# Patient Record
Sex: Female | Born: 1985 | Race: White | Hispanic: No | Smoking: Never smoker
Health system: Southern US, Community
[De-identification: ages and names within clinical notes are randomized; demographics above are authoritative.]

## PROBLEM LIST (undated history)

## (undated) DIAGNOSIS — Z8711 Personal history of peptic ulcer disease: Secondary | ICD-10-CM

## (undated) DIAGNOSIS — Z820 Family history of epilepsy and other diseases of the nervous system: Secondary | ICD-10-CM

## (undated) DIAGNOSIS — J36 Peritonsillar abscess: Secondary | ICD-10-CM

## (undated) DIAGNOSIS — R112 Nausea with vomiting, unspecified: Secondary | ICD-10-CM

## (undated) DIAGNOSIS — Z9889 Other specified postprocedural states: Secondary | ICD-10-CM

## (undated) DIAGNOSIS — E785 Hyperlipidemia, unspecified: Secondary | ICD-10-CM

## (undated) DIAGNOSIS — Z8719 Personal history of other diseases of the digestive system: Secondary | ICD-10-CM

## (undated) HISTORY — DX: Personal history of other diseases of the digestive system: Z87.19

## (undated) HISTORY — DX: Family history of epilepsy and other diseases of the nervous system: Z82.0

## (undated) HISTORY — DX: Hyperlipidemia, unspecified: E78.5

## (undated) HISTORY — DX: Personal history of peptic ulcer disease: Z87.11

## (undated) HISTORY — DX: Peritonsillar abscess: J36

---
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
## 2014-06-18 HISTORY — PX: SEPTOPLASTY: SUR1290

## 2015-01-02 ENCOUNTER — Encounter: Payer: Self-pay | Admitting: Physician Assistant

## 2015-01-02 ENCOUNTER — Ambulatory Visit (INDEPENDENT_AMBULATORY_CARE_PROVIDER_SITE_OTHER): Payer: Managed Care, Other (non HMO) | Admitting: Physician Assistant

## 2015-01-02 VITALS — BP 110/70 | HR 76 | Temp 98.8°F | Resp 16 | Ht 62.0 in | Wt 134.8 lb

## 2015-01-02 DIAGNOSIS — E041 Nontoxic single thyroid nodule: Secondary | ICD-10-CM

## 2015-01-02 DIAGNOSIS — R197 Diarrhea, unspecified: Secondary | ICD-10-CM

## 2015-01-02 DIAGNOSIS — E7800 Pure hypercholesterolemia, unspecified: Secondary | ICD-10-CM

## 2015-01-02 DIAGNOSIS — Z7189 Other specified counseling: Secondary | ICD-10-CM | POA: Diagnosis not present

## 2015-01-02 DIAGNOSIS — Z7689 Persons encountering health services in other specified circumstances: Secondary | ICD-10-CM

## 2015-01-02 NOTE — Progress Notes (Signed)
Patient: Kimberly Cruz, Female    DOB: 19-Dec-1985, 29 y.o.   MRN: [id number] Visit Date: 01/02/2015  Today's Provider: Mar Daring, PA-C   No chief complaint on file.  Subjective:    Annual physical exam Kimberly Cruz is a 29 y.o. female who presents today as a new patient and for health maintenance and complete physical. She feels well. She reports not exercising. She reports she is sleeping well.  Physicians took care of patient are [facility] [phone], [facility] Dr.Michaela Marvel Plan [phone] and [facility] Dr. Beryle Flock [phone].Patient moved from [place], [place] August of this year.  Last pap was 03/16. She states that she has had abnormal Pap smears in the past and did undergo a LEEP procedure many years ago. Most recent Pap that was done in March of this year was normal. She has been cleared from having Pap smears every 6 months to having Pap smears yearly at this time. Will be due for her Pap again in March 2017.  She has never had a mammogram. She does perform monthly self breast exams. There is no known family history of breast cancer.  She has never had a colonoscopy. She does state that her father and her grandmother have both been diagnosed with diverticulitis. There is no family history of colon cancer. Her father also has irritable bowel syndrome.  Patient got sick from the recall of the Hummus 3 weeks ago and per patient still going to the bathroom since she got sick. She states that initially she had upset stomach, fevers, chills, nausea, vomiting and diarrhea. All of the other symptoms have improved except the diarrhea. She states she has 3-4 loose bowels daily. She states that they were just watery stool. She has not noticed any hematochezia or melena. Does have abdominal cramping in the morning.  She also complains of increased rhinorrhea. She states that right before she moved to [place] in August she had underwent a septoplasty and rhinoplasty for deviated septum. Since her surgery she has had increased nasal discharge which also has caused her to have a cough. She states this most often affects her initially when she first awakes in the morning but as she is up throughout the day it does improve. She does not have any known allergies or seasonal allergies. She is planning to see her ENT physician that did her surgery for her over the holiday break.   Review of Systems  Constitutional: Negative.   HENT: Positive for rhinorrhea.   Eyes: Negative.   Respiratory: Negative.   Cardiovascular: Negative.   Gastrointestinal: Positive for diarrhea.  Endocrine: Negative.   Genitourinary: Negative.   Musculoskeletal: Negative.   Skin: Negative.   Allergic/Immunologic: Negative.   Neurological: Negative.   Hematological: Negative.   Psychiatric/Behavioral: Negative.     Social History      She  reports that she has never smoked. She has never used smokeless tobacco. She reports that she does not drink alcohol or use illicit drugs.       Social History   Social History  . Marital Status: Single    Spouse Name: N/A  . Number of Children: N/A  . Years of Education: N/A   Social History Main Topics  . Smoking status: Never Smoker   . Smokeless tobacco: Never Used  . Alcohol Use: No  . Drug Use: No  . Sexual Activity: Not Asked   Other Topics Concern  .  None   Social History Narrative  . None    There are no active problems to display for this patient.   Past Surgical History  Procedure Laterality Date  . Ankle surgery  2000    Family History        Family Status  Relation Status Death Age  . Mother Alive   . Father Alive     colyn polyps        Her family history is not on file.    No Known Allergies  Previous Medications   LEVONORGESTREL-ETHINYL ESTRADIOL (SEASONIQUE) 0.15-0.03 &0.01 MG TABLET    Take 1 tablet by mouth daily.    Patient  Care Team: Mar Daring, PA-C as PCP - General (Family Medicine)     Objective:   Vitals: BP 110/70 mmHg  Pulse 76  Temp(Src) 98.8 F (37.1 C) (Oral)  Resp 16  Ht 5\' 2"  (1.575 m)  Wt 134 lb 12.8 oz (61.145 kg)  BMI 24.65 kg/m2  LMP 11/02/2014   Physical Exam  Constitutional: She is oriented to person, place, and time. She appears well-developed and well-nourished. No distress.  HENT:  Head: Normocephalic and atraumatic.  Right Ear: External ear normal.  Left Ear: External ear normal.  Nose: Nose normal.  Mouth/Throat: Oropharynx is clear and moist. No oropharyngeal exudate.  Eyes: Conjunctivae and EOM are normal. Pupils are equal, round, and reactive to light. Right eye exhibits no discharge. Left eye exhibits no discharge. No scleral icterus.  Neck: Normal range of motion. Neck supple. No JVD present. No tracheal deviation present. Thyroid mass (palpable thyroid nodules bilaterally) present. No thyromegaly present.  Cardiovascular: Normal rate, regular rhythm, normal heart sounds and intact distal pulses.  Exam reveals no gallop and no friction rub.   No murmur heard. Pulmonary/Chest: Effort normal and breath sounds normal. No respiratory distress. She has no wheezes. She has no rales. She exhibits no tenderness.  Abdominal: Soft. She exhibits no shifting dullness, no distension, no fluid wave, no abdominal bruit, no ascites, no pulsatile midline mass and no mass. Bowel sounds are increased. There is no hepatosplenomegaly. There is no tenderness. There is no rebound, no guarding and no CVA tenderness. No hernia.  Musculoskeletal: Normal range of motion. She exhibits no edema or tenderness.  Lymphadenopathy:    She has no cervical adenopathy.  Neurological: She is alert and oriented to person, place, and time.  Skin: Skin is warm and dry. No rash noted. She is not diaphoretic.  Psychiatric: She has a normal mood and affect. Her behavior is normal. Judgment and thought  content normal.  Vitals reviewed.    Depression Screen No flowsheet data found.    Assessment & Plan:     Routine Health Maintenance and Physical Exam  1. Establishing care with new doctor, encounter for Recently moved from [facility] to [place] in August. Establishing with local provider.  2. 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 She has been doing symptomatic treatment and conservative treatment. She has not had any response. She continues to have 3-4 loose bowels daily. I did discuss possibly treating with an antibiotic for the listeria but she would like to undergo testing of her stool prior to treatment. We'll check stool samples as below as well as check labs. I will follow-up with her pending the results of all of these. She is to call the office if symptoms worsen in the  meantime. - Stool Culture - Stool C-Diff Toxin Assay - Stool, WBC/Lactoferrin - Giardia, EIA; Ova/Parasite - CBC with Differential - Comprehensive Metabolic Panel (CMET)  3. Thyroid nodule She does have thyroid nodules bilaterally. She states this was diagnosed 8 years ago. She has had biopsies and they were shown to be benign. She did undergo a thyroid ultrasound last year and they were stable from her previous initial diagnosis. Labs as below and follow-up pending lab results. She is to call the office if she has any questions or concerns in the meantime. - Comprehensive Metabolic Panel (CMET) - TSH  4. Hypercholesterolemia He states that the last time her cholesterol was checked a told her that her cholesterol was high but did not say which portion of the cholesterol panel had been elevated. They just told her to work on lifestyle modification which she has been doing. She has not had her cholesterol checked since then. I will recheck her cholesterol panel as below and follow-up pending lab results.  If cholesterol is stable she will not need this lab check for one year. She is to call the office if she has any questions or concerns in the meantime. - Lipid panel   Exercise Activities and Dietary recommendations Goals    None       There is no immunization history on file for this patient.  Health Maintenance  Topic Date Due  . HIV Screening  06/20/2002  . TETANUS/TDAP  06/20/2006  . PAP SMEAR  03/18/2014  . INFLUENZA VACCINE  08/18/2014      Discussed health benefits of physical activity, and encouraged her to engage in regular exercise appropriate for her age and condition.    --------------------------------------------------------------------

## 2015-01-02 NOTE — Patient Instructions (Signed)
Food Choices to Help Relieve Diarrhea, Adult When you have diarrhea, the foods you eat and your eating habits are very important. Choosing the right foods and drinks can help relieve diarrhea. Also, because diarrhea can last up to 7 days, you need to replace lost fluids and electrolytes (such as sodium, potassium, and chloride) in order to help prevent dehydration.  WHAT GENERAL GUIDELINES DO I NEED TO FOLLOW?  Slowly drink 1 cup (8 oz) of fluid for each episode of diarrhea. If you are getting enough fluid, your urine will be clear or pale yellow.  Eat starchy foods. Some good choices include white rice, white toast, pasta, low-fiber cereal, baked potatoes (without the skin), saltine crackers, and bagels.  Avoid large servings of any cooked vegetables.  Limit fruit to two servings per day. A serving is  cup or 1 small piece.  Choose foods with less than 2 g of fiber per serving.  Limit fats to less than 8 tsp (38 g) per day.  Avoid fried foods.  Eat foods that have probiotics in them. Probiotics can be found in certain dairy products.  Avoid foods and beverages that may increase the speed at which food moves through the stomach and intestines (gastrointestinal tract). Things to avoid include:  High-fiber foods, such as dried fruit, raw fruits and vegetables, nuts, seeds, and whole grain foods.  Spicy foods and high-fat foods.  Foods and beverages sweetened with high-fructose corn syrup, honey, or sugar alcohols such as xylitol, sorbitol, and mannitol. WHAT FOODS ARE RECOMMENDED? Grains White rice. White, French, or pita breads (fresh or toasted), including plain rolls, buns, or bagels. White pasta. Saltine, soda, or graham crackers. Pretzels. Low-fiber cereal. Cooked cereals made with water (such as cornmeal, farina, or cream cereals). Plain muffins. Matzo. Melba toast. Zwieback.  Vegetables Potatoes (without the skin). Strained tomato and vegetable juices. Most well-cooked and canned  vegetables without seeds. Tender lettuce. Fruits Cooked or canned applesauce, apricots, cherries, fruit cocktail, grapefruit, peaches, pears, or plums. Fresh bananas, apples without skin, cherries, grapes, cantaloupe, grapefruit, peaches, oranges, or plums.  Meat and Other Protein Products Baked or boiled chicken. Eggs. Tofu. Fish. Seafood. Smooth peanut butter. Ground or well-cooked tender beef, ham, veal, lamb, pork, or poultry.  Dairy Plain yogurt, kefir, and unsweetened liquid yogurt. Lactose-free milk, buttermilk, or soy milk. Plain hard cheese. Beverages Sport drinks. Clear broths. Diluted fruit juices (except prune). Regular, caffeine-free sodas such as ginger ale. Water. Decaffeinated teas. Oral rehydration solutions. Sugar-free beverages not sweetened with sugar alcohols. Other Bouillon, broth, or soups made from recommended foods.  The items listed above may not be a complete list of recommended foods or beverages. Contact your dietitian for more options. WHAT FOODS ARE NOT RECOMMENDED? Grains Whole grain, whole wheat, bran, or rye breads, rolls, pastas, crackers, and cereals. Wild or brown rice. Cereals that contain more than 2 g of fiber per serving. Corn tortillas or taco shells. Cooked or dry oatmeal. Granola. Popcorn. Vegetables Raw vegetables. Cabbage, broccoli, Brussels sprouts, artichokes, baked beans, beet greens, corn, kale, legumes, peas, sweet potatoes, and yams. Potato skins. Cooked spinach and cabbage. Fruits Dried fruit, including raisins and dates. Raw fruits. Stewed or dried prunes. Fresh apples with skin, apricots, mangoes, pears, raspberries, and strawberries.  Meat and Other Protein Products Chunky peanut butter. Nuts and seeds. Beans and lentils. Bacon.  Dairy High-fat cheeses. Milk, chocolate milk, and beverages made with milk, such as milk shakes. Cream. Ice cream. Sweets and Desserts Sweet rolls, doughnuts, and sweet breads.   Pancakes and waffles. Fats and  Oils Butter. Cream sauces. Margarine. Salad oils. Plain salad dressings. Olives. Avocados.  Beverages Caffeinated beverages (such as coffee, tea, soda, or energy drinks). Alcoholic beverages. Fruit juices with pulp. Prune juice. Soft drinks sweetened with high-fructose corn syrup or sugar alcohols. Other Coconut. Hot sauce. Chili powder. Mayonnaise. Gravy. Cream-based or milk-based soups.  The items listed above may not be a complete list of foods and beverages to avoid. Contact your dietitian for more information. WHAT SHOULD I DO IF I BECOME DEHYDRATED? Diarrhea can sometimes lead to dehydration. Signs of dehydration include dark urine and dry mouth and skin. If you think you are dehydrated, you should rehydrate with an oral rehydration solution. These solutions can be purchased at pharmacies, retail stores, or online.  Drink -1 cup (120-240 mL) of oral rehydration solution each time you have an episode of diarrhea. If drinking this amount makes your diarrhea worse, try drinking smaller amounts more often. For example, drink 1-3 tsp (5-15 mL) every 5-10 minutes.  A general rule for staying hydrated is to drink 1-2 L of fluid per day. Talk to your health care provider about the specific amount you should be drinking each day. Drink enough fluids to keep your urine clear or pale yellow.   This information is not intended to replace advice given to you by your health care provider. Make sure you discuss any questions you have with your health care provider.   Document Released: 03/26/2003 Document Revised: 01/24/2014 Document Reviewed: 11/26/2012 Elsevier Interactive Patient Education 2016 Elsevier Inc. Diarrhea Diarrhea is frequent loose and watery bowel movements. It can cause you to feel weak and dehydrated. Dehydration can cause you to become tired and thirsty, have a dry mouth, and have decreased urination that often is dark yellow. Diarrhea is a sign of another problem, most often an  infection that will not last long. In most cases, diarrhea typically lasts 2-3 days. However, it can last longer if it is a sign of something more serious. It is important to treat your diarrhea as directed by your caregiver to lessen or prevent future episodes of diarrhea. CAUSES  Some common causes include:  Gastrointestinal infections caused by viruses, bacteria, or parasites.  Food poisoning or food allergies.  Certain medicines, such as antibiotics, chemotherapy, and laxatives.  Artificial sweeteners and fructose.  Digestive disorders. HOME CARE INSTRUCTIONS  Ensure adequate fluid intake (hydration): Have 1 cup (8 oz) of fluid for each diarrhea episode. Avoid fluids that contain simple sugars or sports drinks, fruit juices, whole milk products, and sodas. Your urine should be clear or pale yellow if you are drinking enough fluids. Hydrate with an oral rehydration solution that you can purchase at pharmacies, retail stores, and online. You can prepare an oral rehydration solution at home by mixing the following ingredients together:   - tsp table salt.   tsp baking soda.   tsp salt substitute containing potassium chloride.  1  tablespoons sugar.  1 L (34 oz) of water.  Certain foods and beverages may increase the speed at which food moves through the gastrointestinal (GI) tract. These foods and beverages should be avoided and include:  Caffeinated and alcoholic beverages.  High-fiber foods, such as raw fruits and vegetables, nuts, seeds, and whole grain breads and cereals.  Foods and beverages sweetened with sugar alcohols, such as xylitol, sorbitol, and mannitol.  Some foods may be well tolerated and may help thicken stool including:  Starchy foods, such as rice, toast, pasta,   low-sugar cereal, oatmeal, grits, baked potatoes, crackers, and bagels.  Bananas.  Applesauce.  Add probiotic-rich foods to help increase healthy bacteria in the GI tract, such as yogurt and  fermented milk products.  Wash your hands well after each diarrhea episode.  Only take over-the-counter or prescription medicines as directed by your caregiver.  Take a warm bath to relieve any burning or pain from frequent diarrhea episodes. SEEK IMMEDIATE MEDICAL CARE IF:   You are unable to keep fluids down.  You have persistent vomiting.  You have blood in your stool, or your stools are black and tarry.  You do not urinate in 6-8 hours, or there is only a small amount of very dark urine.  You have abdominal pain that increases or localizes.  You have weakness, dizziness, confusion, or light-headedness.  You have a severe headache.  Your diarrhea gets worse or does not get better.  You have a fever or persistent symptoms for more than 2-3 days.  You have a fever and your symptoms suddenly get worse. MAKE SURE YOU:   Understand these instructions.  Will watch your condition.  Will get help right away if you are not doing well or get worse.   This information is not intended to replace advice given to you by your health care provider. Make sure you discuss any questions you have with your health care provider.   Document Released: 12/24/2001 Document Revised: 01/24/2014 Document Reviewed: 09/11/2011 Elsevier Interactive Patient Education 2016 Elsevier Inc.  

## 2015-01-03 LAB — COMPREHENSIVE METABOLIC PANEL
A/G RATIO: 1.7 (ref 1.1–2.5)
ALK PHOS: 58 IU/L (ref 39–117)
ALT: 13 IU/L (ref 0–32)
AST: 14 IU/L (ref 0–40)
Albumin: 4.5 g/dL (ref 3.5–5.5)
BILIRUBIN TOTAL: 0.5 mg/dL (ref 0.0–1.2)
BUN/Creatinine Ratio: 11 (ref 8–20)
BUN: 7 mg/dL (ref 6–20)
CHLORIDE: 99 mmol/L (ref 96–106)
CO2: 23 mmol/L (ref 18–29)
Calcium: 9.4 mg/dL (ref 8.7–10.2)
Creatinine, Ser: 0.64 mg/dL (ref 0.57–1.00)
GFR calc Af Amer: 141 mL/min/{1.73_m2} (ref >59)
GFR calc non Af Amer: 123 mL/min/{1.73_m2} (ref >59)
GLUCOSE: 77 mg/dL (ref 65–99)
Globulin, Total: 2.6 g/dL (ref 1.5–4.5)
POTASSIUM: 4.1 mmol/L (ref 3.5–5.2)
Sodium: 140 mmol/L (ref 134–144)
Total Protein: 7.1 g/dL (ref 6.0–8.5)

## 2015-01-03 LAB — LIPID PANEL
CHOL/HDL RATIO: 4.4 ratio (ref 0.0–4.4)
CHOLESTEROL TOTAL: 188 mg/dL (ref 100–199)
HDL: 43 mg/dL (ref >39)
LDL Calculated: 120 mg/dL — ABNORMAL HIGH (ref 0–99)
TRIGLYCERIDES: 123 mg/dL (ref 0–149)
VLDL Cholesterol Cal: 25 mg/dL (ref 5–40)

## 2015-01-03 LAB — CBC WITH DIFFERENTIAL/PLATELET
BASOS ABS: 0 10*3/uL (ref 0.0–0.2)
BASOS: 0 %
EOS (ABSOLUTE): 0.1 10*3/uL (ref 0.0–0.4)
Eos: 1 %
Hematocrit: 42 % (ref 34.0–46.6)
Hemoglobin: 14.4 g/dL (ref 11.1–15.9)
IMMATURE GRANS (ABS): 0 10*3/uL (ref 0.0–0.1)
Immature Granulocytes: 0 %
LYMPHS: 22 %
Lymphocytes Absolute: 1.8 10*3/uL (ref 0.7–3.1)
MCH: 31.1 pg (ref 26.6–33.0)
MCHC: 34.3 g/dL (ref 31.5–35.7)
MCV: 91 fL (ref 79–97)
MONOS ABS: 0.6 10*3/uL (ref 0.1–0.9)
Monocytes: 7 %
NEUTROS ABS: 5.9 10*3/uL (ref 1.4–7.0)
NEUTROS PCT: 70 %
PLATELETS: 316 10*3/uL (ref 150–379)
RBC: 4.63 x10E6/uL (ref 3.77–5.28)
RDW: 12.3 % (ref 12.3–15.4)
WBC: 8.4 10*3/uL (ref 3.4–10.8)

## 2015-01-03 LAB — TSH: TSH: 0.643 u[IU]/mL (ref 0.450–4.500)

## 2015-01-05 ENCOUNTER — Telehealth: Payer: Self-pay

## 2015-01-05 NOTE — Telephone Encounter (Signed)
Patient advised as directed below.  Thanks,  -Joseline 

## 2015-01-05 NOTE — Telephone Encounter (Signed)
-----   Message from Mar Daring, [place] sent at 01/05/2015  9:20 AM EST ----- All labs are within normal limits and stable with the exception of LDL (bad) cholesterol. All other elements of cholesterol panel are within normal limits including total cholesterol being 188 and triglycerides being 123. LDL was elevated at 120. I would like to try to see this number below 100. This can be done with lifestyle modification including limiting high cholesterol, high saturated fats in diet. Also this can be lowered with physical activity. Tried to exercise approximately 30-40 minutes 3-4 days a week. Also adding Fish oil 1200 mg twice daily can help to lower her bad cholesterol and increase the good cholesterol. We will recheck labs in one year.  Thanks! -JB

## 2015-02-02 LAB — CLOSTRIDIUM DIFFICILE EIA: C DIFFICILE TOXINS A+ B, EIA: NEGATIVE

## 2015-02-03 LAB — STOOL CULTURE: E coli, Shiga toxin Assay: NEGATIVE

## 2015-02-05 LAB — GIARDIA, EIA; OVA/PARASITE: GIARDIA AG STL: NEGATIVE

## 2015-02-05 LAB — FECAL LACTOFERRIN, QUANT: Lactoferrin, Fecal, Quant.: 6.85 ug/mL(g) (ref 0.00–7.24)

## 2015-02-06 ENCOUNTER — Telehealth: Payer: Self-pay

## 2015-02-06 DIAGNOSIS — R197 Diarrhea, unspecified: Secondary | ICD-10-CM

## 2015-02-06 DIAGNOSIS — Z1283 Encounter for screening for malignant neoplasm of skin: Secondary | ICD-10-CM

## 2015-02-06 NOTE — Telephone Encounter (Signed)
LMTCB  Thanks,  -Joseline 

## 2015-02-06 NOTE — Telephone Encounter (Signed)
Pt returned call and asked for return call at work. Pt will get off work today at 12 pm so if it's after 12 please call her cell#. Thanks TNP

## 2015-02-06 NOTE — Telephone Encounter (Signed)
-----   Message from Mar Daring, [place] sent at 02/06/2015  8:50 AM EST ----- All stool cultures were negative.  Would she like referral to GI for further evaluation?

## 2015-02-06 NOTE — Telephone Encounter (Signed)
Patient advised as directed below. She does want the GI referral and also asked if you can refer her to Dermatologist-she has some concerns she will like to address.  Thanks,  -Cassandra Mcmanaman

## 2015-02-06 NOTE — Telephone Encounter (Signed)
Referrals made

## 2015-03-04 ENCOUNTER — Encounter: Payer: Self-pay | Admitting: Certified Nurse Midwife

## 2015-03-04 ENCOUNTER — Ambulatory Visit (INDEPENDENT_AMBULATORY_CARE_PROVIDER_SITE_OTHER): Payer: Managed Care, Other (non HMO) | Admitting: Certified Nurse Midwife

## 2015-03-04 VITALS — BP 125/80 | HR 73 | Resp 18 | Ht 62.0 in | Wt 135.0 lb

## 2015-03-04 DIAGNOSIS — N898 Other specified noninflammatory disorders of vagina: Secondary | ICD-10-CM

## 2015-03-04 DIAGNOSIS — Z113 Encounter for screening for infections with a predominantly sexual mode of transmission: Secondary | ICD-10-CM | POA: Diagnosis not present

## 2015-03-04 NOTE — Patient Instructions (Signed)

## 2015-03-04 NOTE — Progress Notes (Signed)
Patient ID: Kimberly Cruz, female   DOB: [birth year], 30 y.o.   MRN: [id number] CC: Vaginal Discharge    None     HPI Kimberly Cruz is a 30 y.o. [id number] who presents with onset of vaginal discharge and odor x 1 week   Past Medical History  Diagnosis Date  . Hyperlipidemia   . FHx: migraine headaches   . History of stomach ulcers     OB History  Gravida Para Term Preterm AB SAB TAB Ectopic Multiple Living  0 0 0 0 0 0 0 0 0 0         Past Surgical History  Procedure Laterality Date  . Ankle surgery  2000  . Septoplasty  06/2014    Social History   Social History  . Marital Status: Single    Spouse Name: N/A  . Number of Children: N/A  . Years of Education: N/A   Occupational History  . Not on file.   Social History Main Topics  . Smoking status: Never Smoker   . Smokeless tobacco: Never Used  . Alcohol Use: No  . Drug Use: No  . Sexual Activity: Yes    Birth Control/ Protection: Pill   Other Topics Concern  . Not on file   Social History Narrative    Current Outpatient Prescriptions on File Prior to Visit  Medication Sig Dispense Refill  . Levonorgestrel-Ethinyl Estradiol (SEASONIQUE) 0.15-0.03 &0.01 MG tablet Take 1 tablet by mouth daily.     No current facility-administered medications on file prior to visit.    No Known Allergies   ROS Pertinent items in HPI   PHYSICAL EXAM Filed Vitals:   03/04/15 1353  BP: 125/80  Pulse: 73  Resp: 18   General: Well nourished, well developed female in no acute distress Cardiovascular: Normal rate Respiratory: Normal effort Abdomen: Soft, nontender Back: No CVAT Extremities: No edema Neurologic:grossly intact Speculum exam: NEFG; vagina with physiologic discharge, no blood; cervix clean Bimanual exam: cervix closed, no CMT; uterus NSSP; no adnexal tenderness or masses      ASSESSMENT  1. Vaginal discharge   Wet Prep Gc Ch cx  PLAN Return Prn and for annual exam in June 2017     Medication List       This list is accurate as of: 03/04/15  2:16 PM.  Always use your most recent med list.               SEASONIQUE 0.15-0.03 &0.01 MG tablet  Generic drug:  Levonorgestrel-Ethinyl Estradiol  Take 1 tablet by mouth daily.          Larey Days, CNM 03/04/2015 2:16 PM

## 2015-03-05 ENCOUNTER — Telehealth: Payer: Self-pay | Admitting: *Deleted

## 2015-03-05 DIAGNOSIS — N760 Acute vaginitis: Principal | ICD-10-CM

## 2015-03-05 DIAGNOSIS — B9689 Other specified bacterial agents as the cause of diseases classified elsewhere: Secondary | ICD-10-CM

## 2015-03-05 LAB — GC/CHLAMYDIA PROBE AMP (~~LOC~~) NOT AT ARMC
Chlamydia: NEGATIVE
Neisseria Gonorrhea: NEGATIVE

## 2015-03-05 LAB — WET PREP, GENITAL
Trich, Wet Prep: NONE SEEN
WBC, Wet Prep HPF POC: NONE SEEN
Yeast Wet Prep HPF POC: NONE SEEN

## 2015-03-05 MED ORDER — METRONIDAZOLE 500 MG PO TABS: 500.0000 mg | ORAL_TABLET | Freq: Two times a day (BID) | ORAL | Status: DC

## 2015-03-05 NOTE — Telephone Encounter (Signed)
Pt called requesting result of wet prep, informed pt that result was indicative of BV and that medication would be sent to pharmacy, advised on medication use.  Pt will call back with any further issues.

## 2015-03-25 ENCOUNTER — Other Ambulatory Visit: Payer: Self-pay | Admitting: *Deleted

## 2015-03-25 DIAGNOSIS — Z3041 Encounter for surveillance of contraceptive pills: Secondary | ICD-10-CM

## 2015-03-25 MED ORDER — LEVONORGEST-ETH ESTRAD 91-DAY 0.15-0.03 &0.01 MG PO TABS: 1.0000 | ORAL_TABLET | Freq: Every day | ORAL | Status: DC

## 2015-03-25 NOTE — Telephone Encounter (Signed)
RF authorization for [facility] sent to [facility].  Pt received enough until 6/17

## 2015-06-02 ENCOUNTER — Encounter: Payer: Self-pay | Admitting: Obstetrics and Gynecology

## 2015-06-02 ENCOUNTER — Ambulatory Visit (INDEPENDENT_AMBULATORY_CARE_PROVIDER_SITE_OTHER): Payer: Managed Care, Other (non HMO) | Admitting: Obstetrics and Gynecology

## 2015-06-02 VITALS — BP 117/76 | HR 71 | Ht 62.0 in | Wt 136.2 lb

## 2015-06-02 DIAGNOSIS — Z113 Encounter for screening for infections with a predominantly sexual mode of transmission: Secondary | ICD-10-CM | POA: Diagnosis not present

## 2015-06-02 DIAGNOSIS — Z124 Encounter for screening for malignant neoplasm of cervix: Secondary | ICD-10-CM

## 2015-06-02 DIAGNOSIS — Z01419 Encounter for gynecological examination (general) (routine) without abnormal findings: Secondary | ICD-10-CM

## 2015-06-02 DIAGNOSIS — Z1151 Encounter for screening for human papillomavirus (HPV): Secondary | ICD-10-CM

## 2015-06-02 NOTE — Progress Notes (Signed)
Pt here for annual with pap smear. Pt requesting full STI labs. Need refill on her BC.

## 2015-06-02 NOTE — Progress Notes (Signed)
  Subjective:     Kimberly Cruz is a 30 y.o. female G49 with BMI 25 who is here for a comprehensive physical exam. The patient reports no problems. She is sexually active using OCP for contraception. She denies abnormal vaginal bleeding, discharge or pelvic pain  Past Medical History  Diagnosis Date  . Hyperlipidemia   . FHx: migraine headaches   . History of stomach ulcers    Past Surgical History  Procedure Laterality Date  . Ankle surgery  2000  . Septoplasty  06/2014   Family History  Problem Relation Age of Onset  . Diverticulitis Father   . Diabetes Maternal Grandmother   . Heart disease Maternal Grandmother   . Diverticulitis Paternal Grandmother     Social History   Social History  . Marital Status: Single    Spouse Name: N/A  . Number of Children: N/A  . Years of Education: N/A   Occupational History  . Not on file.   Social History Main Topics  . Smoking status: Never Smoker   . Smokeless tobacco: Never Used  . Alcohol Use: No  . Drug Use: No  . Sexual Activity: Yes    Birth Control/ Protection: Pill   Other Topics Concern  . Not on file   Social History Narrative   Health Maintenance  Topic Date Due  . HIV Screening  06/20/2002  . TETANUS/TDAP  06/20/2006  . PAP SMEAR  06/19/2008  . INFLUENZA VACCINE  08/18/2015       Review of Systems Pertinent items are noted in HPI.   Objective:  Blood pressure 117/76, pulse 71, height 5\' 2"  (1.575 m), weight 136 lb 3.2 oz (61.78 kg), last menstrual period 04/19/2015.     GENERAL: Well-developed, well-nourished female in no acute distress.  HEENT: Normocephalic, atraumatic. Sclerae anicteric.  NECK: Supple. Normal thyroid.  LUNGS: Clear to auscultation bilaterally.  HEART: Regular rate and rhythm. BREASTS: Symmetric in size. No palpable masses or lymphadenopathy, skin changes, or nipple drainage. ABDOMEN: Soft, nontender, nondistended. No organomegaly. PELVIC: Normal external female genitalia. Vagina  is pink and rugated.  Normal discharge. Normal appearing cervix. Uterus is normal in size. No adnexal mass or tenderness. EXTREMITIES: No cyanosis, clubbing, or edema, 2+ distal pulses.    Assessment:    Healthy female exam.      Plan:    Pap smear collected Patient desires STI testing (HIV, Hep B, Hep C, RPR ordered) Patient will be contacted with any abnormal results Refill on Seasonique provided See After Visit Summary for Counseling Recommendations

## 2015-06-03 LAB — HEPATITIS B SURFACE ANTIGEN: HEP B S AG: NEGATIVE

## 2015-06-03 LAB — HEPATITIS C ANTIBODY: HCV AB: NEGATIVE

## 2015-06-03 LAB — HIV ANTIBODY (ROUTINE TESTING W REFLEX): HIV: NONREACTIVE

## 2015-06-03 LAB — CYTOLOGY - PAP

## 2015-06-03 LAB — RPR

## 2015-06-22 ENCOUNTER — Ambulatory Visit: Payer: Managed Care, Other (non HMO) | Admitting: Obstetrics & Gynecology

## 2015-06-26 ENCOUNTER — Telehealth: Payer: Self-pay | Admitting: Physician Assistant

## 2015-06-26 DIAGNOSIS — B379 Candidiasis, unspecified: Secondary | ICD-10-CM

## 2015-06-26 DIAGNOSIS — T3695XA Adverse effect of unspecified systemic antibiotic, initial encounter: Principal | ICD-10-CM

## 2015-06-26 MED ORDER — FLUCONAZOLE 150 MG PO TABS: 150.0000 mg | ORAL_TABLET | Freq: Once | ORAL | Status: DC

## 2015-06-26 NOTE — Telephone Encounter (Signed)
Pt went to [facility] ER a couple of weeks ago for abscess in her throat.  They gave her an antibiotic and one diflucan.   She has finished the antibiotic but still has the yeast infection.  She wants to know if you will send another dose of diflucan to the pharmacy.  She uses [facility],  Please advise Pt as to what you will do.  Her call  Back is [phone]  [facility]

## 2015-06-29 NOTE — Telephone Encounter (Signed)
I sent in Kimberly Cruz on Friday and notified patient via mychart.

## 2015-07-13 NOTE — Discharge Instructions (Signed)
T & A INSTRUCTION SHEET - [facility] °[facility] ° °CREIGHTON VAUGHT, MD °PAUL H. JUENGEL, MD  °[facility] °CHAPMAN MCQUEEN, MD ° °[address] [place], [place] [address] TEL. [phone] °[address] [place] [address] [phone] ° °INFORMATION SHEET FOR A TONSILLECTOMY AND ADENDOIDECTOMY ° °About Your Tonsils and Adenoids ° The tonsils and adenoids are normal body tissues that are part of our immune system.  They normally help to protect us against diseases that may enter our mouth and nose.  However, sometimes the tonsils and/or adenoids become too large and obstruct our breathing, especially at night. °  ° If either of these things happen it helps to remove the tonsils and adenoids in order to become healthier. The operation to remove the tonsils and adenoids is called a tonsillectomy and adenoidectomy. ° °The Location of Your Tonsils and Adenoids ° The tonsils are located in the back of the throat on both side and sit in a cradle of muscles. The adenoids are located in the roof of the mouth, behind the nose, and closely associated with the opening of the Eustachian tube to the ear. ° °Surgery on Tonsils and Adenoids ° A tonsillectomy and adenoidectomy is a short operation which takes about thirty minutes.  This includes being put to sleep and being awakened.  Tonsillectomies and adenoidectomies are performed at [facility] and may require observation period in the recovery room prior to going home. ° °Following the Operation for a Tonsillectomy ° A cautery machine is used to control bleeding.  Bleeding from a tonsillectomy and adenoidectomy is minimal and postoperatively the risk of bleeding is approximately four percent, although this rarely life threatening. ° ° ° °After your tonsillectomy and adenoidectomy post-op care at home: ° °1. Our patients are able to go home the same day.  You may be given prescriptions for pain  medications and antibiotics, if indicated. °2. It is extremely important to remember that fluid intake is of utmost importance after a tonsillectomy.  The amount that you drink must be maintained in the postoperative period.  A good indication of whether a child is getting enough fluid is whether his/her urine output is constant.  As long as children are urinating or wetting their diaper every 6 - 8 hours this is usually enough fluid intake.   °3. Although rare, this is a risk of some bleeding in the first ten days after surgery.  This is usually occurs between day five and nine postoperatively.  This risk of bleeding is approximately four percent.  If you or your child should have any bleeding you should remain calm and notify our office or go directly to the Emergency Room at [facility] where they will contact us. Our doctors are available seven days a week for notification.  We recommend sitting up quietly in a chair, place an ice pack on the front of the neck and spitting out the blood gently until we are able to contact you.  Adults should gargle gently with ice water and this may help stop the bleeding.  If the bleeding does not stop after a short time, i.e. 10 to 15 minutes, or seems to be increasing again, please contact us or go to the hospital.   °4. It is common for the pain to be worse at 5 - 7 days postoperatively.  This occurs because the “scab” is peeling off and the mucous membrane (skin of   the throat) is growing back where the tonsils were.   °5. It is common for a low-grade fever, less than 102, during the first week after a tonsillectomy and adenoidectomy.  It is usually due to not drinking enough liquids, and we suggest your use liquid Tylenol or the pain medicine with Tylenol prescribed in order to keep your temperature below 102.  Please follow the directions on the back of the bottle. °6. Do not take aspirin or any products that contain aspirin such as Bufferin, Anacin,  Ecotrin, aspirin gum, Goodies, BC headache powders, etc., after a T&A because it can promote bleeding.  Please check with our office before administering any other medication that may been prescribed by other doctors during the two week post-operative period. °7. If you happen to look in the mirror or into your child’s mouth you will see white/gray patches on the back of the throat.  This is what a scab looks like in the mouth and is normal after having a T&A.  It will disappear once the tonsil area heals completely. However, it may cause a noticeable odor, and this too will disappear with time.     °8. You or your child may experience ear pain after having a T&A.  This is called referred pain and comes from the throat, but it is felt in the ears.  Ear pain is quite common and expected.  It will usually go away after ten days.  There is usually nothing wrong with the ears, and it is primarily due to the healing area stimulating the nerve to the ear that runs along the side of the throat.  Use either the prescribed pain medicine or Tylenol as needed.  °9. The throat tissues after a tonsillectomy are obviously sensitive.  Smoking around children who have had a tonsillectomy significantly increases the risk of bleeding.  DO NOT SMOKE!  ° °General Anesthesia, Adult, Care After °Refer to this sheet in the next few weeks. These instructions provide you with information on caring for yourself after your procedure. Your health care provider may also give you more specific instructions. Your treatment has been planned according to current medical practices, but problems sometimes occur. Call your health care provider if you have any problems or questions after your procedure. °WHAT TO EXPECT AFTER THE PROCEDURE °After the procedure, it is typical to experience: °· Sleepiness. °· Nausea and vomiting. °HOME CARE INSTRUCTIONS °· For the first 24 hours after general anesthesia: °¨ Have a responsible person with you. °¨ Do not  drive a car. If you are alone, do not take public transportation. °¨ Do not drink alcohol. °¨ Do not take medicine that has not been prescribed by your health care provider. °¨ Do not sign important papers or make important decisions. °¨ You may resume a normal diet and activities as directed by your health care provider. °· Change bandages (dressings) as directed. °· If you have questions or problems that seem related to general anesthesia, call the hospital and ask for the anesthetist or anesthesiologist on call. °SEEK MEDICAL CARE IF: °· You have nausea and vomiting that continue the day after anesthesia. °· You develop a rash. °SEEK IMMEDIATE MEDICAL CARE IF:  °· You have difficulty breathing. °· You have chest pain. °· You have any allergic problems. °  °This information is not intended to replace advice given to you by your health care provider. Make sure you discuss any questions you have with your health care provider. °  °Document   Released: 04/11/2000 Document Revised: 01/24/2014 Document Reviewed: 05/04/2011 °Elsevier Interactive Patient Education ©2016 [employer] ° °

## 2015-07-17 ENCOUNTER — Ambulatory Visit: Payer: Managed Care, Other (non HMO) | Admitting: Anesthesiology

## 2015-07-17 ENCOUNTER — Ambulatory Visit
Admission: RE | Admit: 2015-07-17 | Discharge: 2015-07-17 | Disposition: A | Discharge status: Home / Self Care | Payer: Managed Care, Other (non HMO) | Source: Ambulatory Visit | Attending: Unknown Physician Specialty | Admitting: Unknown Physician Specialty

## 2015-07-17 ENCOUNTER — Encounter
Admission: RE | Disposition: A | Discharge status: Home / Self Care | Payer: Self-pay | Source: Ambulatory Visit | Attending: Unknown Physician Specialty

## 2015-07-17 DIAGNOSIS — Z9889 Other specified postprocedural states: Secondary | ICD-10-CM | POA: Diagnosis not present

## 2015-07-17 DIAGNOSIS — Z87892 Personal history of anaphylaxis: Secondary | ICD-10-CM | POA: Insufficient documentation

## 2015-07-17 DIAGNOSIS — J3501 Chronic tonsillitis: Secondary | ICD-10-CM | POA: Insufficient documentation

## 2015-07-17 DIAGNOSIS — Z888 Allergy status to other drugs, medicaments and biological substances status: Secondary | ICD-10-CM | POA: Insufficient documentation

## 2015-07-17 DIAGNOSIS — Z79899 Other long term (current) drug therapy: Secondary | ICD-10-CM | POA: Insufficient documentation

## 2015-07-17 DIAGNOSIS — J039 Acute tonsillitis, unspecified: Secondary | ICD-10-CM | POA: Diagnosis present

## 2015-07-17 DIAGNOSIS — Z833 Family history of diabetes mellitus: Secondary | ICD-10-CM | POA: Insufficient documentation

## 2015-07-17 DIAGNOSIS — J351 Hypertrophy of tonsils: Secondary | ICD-10-CM | POA: Diagnosis present

## 2015-07-17 DIAGNOSIS — Z793 Long term (current) use of hormonal contraceptives: Secondary | ICD-10-CM | POA: Insufficient documentation

## 2015-07-17 HISTORY — DX: Other specified postprocedural states: Z98.890

## 2015-07-17 HISTORY — DX: Nausea with vomiting, unspecified: R11.2

## 2015-07-17 HISTORY — PX: TONSILLECTOMY: SHX5217

## 2015-07-17 SURGERY — TONSILLECTOMY
Anesthesia: General | Site: Throat | Wound class: Clean Contaminated

## 2015-07-17 MED ORDER — ONDANSETRON HCL 4 MG/2ML IJ SOLN: 4.0000 mg | Freq: Once | INTRAMUSCULAR | Status: DC | PRN

## 2015-07-17 MED ORDER — HYDROCODONE-ACETAMINOPHEN 7.5-325 MG/15ML PO SOLN: 15.0000 mL | ORAL | Status: DC | PRN

## 2015-07-17 MED ORDER — GLYCOPYRROLATE 0.2 MG/ML IJ SOLN
INTRAMUSCULAR | Status: DC | PRN
  Administered 2015-07-17: 0.1 mg via INTRAVENOUS

## 2015-07-17 MED ORDER — SCOPOLAMINE 1 MG/3DAYS TD PT72
1.0000 | MEDICATED_PATCH | Freq: Once | TRANSDERMAL | Status: DC
  Administered 2015-07-17: 1.5 mg via TRANSDERMAL

## 2015-07-17 MED ORDER — PROPOFOL 10 MG/ML IV BOLUS
INTRAVENOUS | Status: DC | PRN
  Administered 2015-07-17: 50 mg via INTRAVENOUS
  Administered 2015-07-17: 150 mg via INTRAVENOUS

## 2015-07-17 MED ORDER — DEXAMETHASONE SODIUM PHOSPHATE 4 MG/ML IJ SOLN
INTRAMUSCULAR | Status: DC | PRN
Start: 2015-07-17 — End: 2015-07-17
  Administered 2015-07-17: 8 mg via INTRAVENOUS

## 2015-07-17 MED ORDER — SUCCINYLCHOLINE CHLORIDE 20 MG/ML IJ SOLN
INTRAMUSCULAR | Status: DC | PRN
  Administered 2015-07-17: 80 mg via INTRAVENOUS

## 2015-07-17 MED ORDER — FENTANYL CITRATE (PF) 100 MCG/2ML IJ SOLN
INTRAMUSCULAR | Status: DC | PRN
  Administered 2015-07-17: 50 ug via INTRAVENOUS
  Administered 2015-07-17: 100 ug via INTRAVENOUS

## 2015-07-17 MED ORDER — BUPIVACAINE HCL (PF) 0.5 % IJ SOLN
INTRAMUSCULAR | Status: DC | PRN
Start: 2015-07-17 — End: 2015-07-17
  Administered 2015-07-17: 7 mL

## 2015-07-17 MED ORDER — OXYCODONE HCL 5 MG PO TABS: 5.0000 mg | ORAL_TABLET | Freq: Once | ORAL | Status: DC

## 2015-07-17 MED ORDER — ACETAMINOPHEN 10 MG/ML IV SOLN
1000.0000 mg | Freq: Once | INTRAVENOUS | Status: AC
  Administered 2015-07-17: 1000 mg via INTRAVENOUS

## 2015-07-17 MED ORDER — MIDAZOLAM HCL 5 MG/5ML IJ SOLN
INTRAMUSCULAR | Status: DC | PRN
  Administered 2015-07-17: 2 mg via INTRAVENOUS

## 2015-07-17 MED ORDER — LACTATED RINGERS IV SOLN
INTRAVENOUS | Status: DC
  Administered 2015-07-17: 09:00:00 via INTRAVENOUS

## 2015-07-17 MED ORDER — LIDOCAINE HCL (CARDIAC) 20 MG/ML IV SOLN
INTRAVENOUS | Status: DC | PRN
  Administered 2015-07-17: 30 mg via INTRAVENOUS

## 2015-07-17 MED ORDER — FENTANYL CITRATE (PF) 100 MCG/2ML IJ SOLN: 25.0000 ug | INTRAMUSCULAR | Status: DC | PRN

## 2015-07-17 MED ORDER — ONDANSETRON HCL 4 MG/2ML IJ SOLN
INTRAMUSCULAR | Status: DC | PRN
  Administered 2015-07-17: 4 mg via INTRAVENOUS

## 2015-07-17 MED ORDER — OXYCODONE HCL 5 MG/5ML PO SOLN
5.0000 mg | Freq: Once | ORAL | Status: AC
  Administered 2015-07-17: 5 mg via ORAL

## 2015-07-17 MED ORDER — ACETAMINOPHEN 10 MG/ML IV SOLN: 1000.0000 mg | Freq: Once | INTRAVENOUS | Status: AC

## 2015-07-17 SURGICAL SUPPLY — 17 items
CANISTER SUCT 1200ML W/VALVE (MISCELLANEOUS) ×2 IMPLANT
COAG SUCT 10F 3.5MM HAND CTRL (MISCELLANEOUS) ×2 IMPLANT
DRAPE HEAD BAR (DRAPES) ×2 IMPLANT
ELECT CAUTERY BLADE TIP 2.5 (TIP) ×2
ELECTRODE CAUTERY BLDE TIP 2.5 (TIP) ×1 IMPLANT
GLOVE BIO SURGEON STRL SZ7.5 (GLOVE) ×4 IMPLANT
HANDLE SUCTION POOLE (INSTRUMENTS) ×1 IMPLANT
KIT ROOM TURNOVER OR (KITS) ×2 IMPLANT
NEEDLE HYPO 25GX1X1/2 BEV (NEEDLE) ×2 IMPLANT
NS IRRIG 500ML POUR BTL (IV SOLUTION) ×2 IMPLANT
PACK TONSIL/ADENOIDS (PACKS) ×2 IMPLANT
PAD GROUND ADULT SPLIT (MISCELLANEOUS) ×2 IMPLANT
PENCIL ELECTRO HAND CTR (MISCELLANEOUS) ×2 IMPLANT
STRAP BODY AND KNEE 60X3 (MISCELLANEOUS) ×2 IMPLANT
SUCTION POOLE HANDLE (INSTRUMENTS) ×2
SYR 5ML LL (SYRINGE) ×2 IMPLANT
SYRINGE 10CC LL (SYRINGE) IMPLANT

## 2015-07-17 NOTE — Anesthesia Procedure Notes (Signed)
Procedure Name: Intubation Date/Time: 07/17/2015 9:47 AM Performed by: Londell Moh Pre-anesthesia Checklist: Patient identified, Emergency Drugs available, Suction available, Patient being monitored and Timeout performed Patient Re-evaluated:Patient Re-evaluated prior to inductionOxygen Delivery Method: Circle system utilized Preoxygenation: Pre-oxygenation with 100% oxygen Intubation Type: IV induction Ventilation: Mask ventilation without difficulty Laryngoscope Size: Mac and 3 Grade View: Grade I Tube type: Oral Rae Tube size: 7.0 mm Number of attempts: 2 Airway Equipment and Method: Bougie stylet Placement Confirmation: ETT inserted through vocal cords under direct vision,  positive ETCO2 and breath sounds checked- equal and bilateral Tube secured with: Tape Dental Injury: Teeth and Oropharynx as per pre-operative assessment  Difficulty Due To: Difficulty was unanticipated and Difficult Airway- due to anterior larynx

## 2015-07-17 NOTE — Op Note (Signed)
PREOPERATIVE DIAGNOSIS:  TONSIL HYPERTROPHY ACUTE TONSILLITIS  POSTOPERATIVE DIAGNOSIS:  Chronic Tonsillitis  OPERATION:  Tonsillectomy.  SURGEON:  Roena Malady, MD  ANESTHESIA:  General endotracheal.  OPERATIVE FINDINGS:  Large tonsils.  DESCRIPTION OF THE PROCEDURE: Kimberly Cruz was identified in the holding area and taken to the operating room and placed in the supine position.  After general endotracheal anesthesia, the table was turned 45 degrees and the patient was draped in the usual fashion for a tonsillectomy.  A mouth gag was inserted into the oral cavity.  There were large tonsils.  Beginning on the left-hand side a tenaculum was used to grasp the tonsil and the Bovie cautery was used to dissect it free from the fossa.  In a similar fashion, the right tonsil was removed.  Meticulous hemostasis was achieved using the Bovie cautery.  With both tonsils removed and no active bleeding, 0.5% plain Marcaine was used to inject the anterior and posterior tonsillar pillars bilaterally.  A total of 81ml was used.  The patient tolerated the procedure well and was awakened in the operating room and taken to the recovery room in stable condition.   CULTURES:  None.  SPECIMENS:  Tonsils.  ESTIMATED BLOOD LOSS:  Less than 10 ml.  Kimberly Cruz T  07/17/2015  10:05 AM

## 2015-07-17 NOTE — Anesthesia Postprocedure Evaluation (Signed)
Anesthesia Post Note  Patient: Kimberly Cruz  Procedure(s) Performed: Procedure(s) (LRB): TONSILLECTOMY (N/A)  Patient location during evaluation: PACU Anesthesia Type: General Level of consciousness: awake and alert and oriented Pain management: satisfactory to patient Vital Signs Assessment: post-procedure vital signs reviewed and stable Respiratory status: spontaneous breathing, nonlabored ventilation and respiratory function stable Cardiovascular status: blood pressure returned to baseline and stable Postop Assessment: Adequate PO intake and No signs of nausea or vomiting Anesthetic complications: no    Raliegh Ip

## 2015-07-17 NOTE — Transfer of Care (Signed)
Immediate Anesthesia Transfer of Care Note  Patient: Kimberly Cruz  Procedure(s) Performed: Procedure(s): TONSILLECTOMY (N/A)  Patient Location: PACU  Anesthesia Type: General ETT  Level of Consciousness: awake, alert  and patient cooperative  Airway and Oxygen Therapy: Patient Spontanous Breathing and Patient connected to supplemental oxygen  Post-op Assessment: Post-op Vital signs reviewed, Patient's Cardiovascular Status Stable, Respiratory Function Stable, Patent Airway and No signs of Nausea or vomiting  Post-op Vital Signs: Reviewed and stable  Complications: No apparent anesthesia complications

## 2015-07-17 NOTE — H&P (Signed)
  H+P  Reviewed and will be scanned in later. No changes noted. 

## 2015-07-17 NOTE — Anesthesia Preprocedure Evaluation (Signed)
Anesthesia Evaluation  Patient identified by MRN, date of birth, ID band  Reviewed: Allergy & Precautions, H&P , NPO status , Patient's Chart, lab work & pertinent test results  Airway Mallampati: II  TM Distance: >3 FB Neck ROM: full    Dental no notable dental hx.    Pulmonary    Pulmonary exam normal        Cardiovascular  Rhythm:regular Rate:Normal     Neuro/Psych    GI/Hepatic   Endo/Other    Renal/GU      Musculoskeletal   Abdominal   Peds  Hematology   Anesthesia Other Findings   Reproductive/Obstetrics                             Anesthesia Physical Anesthesia Plan  ASA: I  Anesthesia Plan: General ETT   Post-op Pain Management:    Induction: Intravenous  Airway Management Planned: Oral ETT  Additional Equipment:   Intra-op Plan:   Post-operative Plan:   Informed Consent: I have reviewed the patients History and Physical, chart, labs and discussed the procedure including the risks, benefits and alternatives for the proposed anesthesia with the patient or authorized representative who has indicated his/her understanding and acceptance.     Plan Discussed with: CRNA  Anesthesia Plan Comments:         Anesthesia Quick Evaluation

## 2015-07-20 NOTE — Progress Notes (Signed)
Patient states that her tongue has been numb ever since she left surgery center. Instructed patient that she should call Mcqueen's office

## 2015-07-22 LAB — SURGICAL PATHOLOGY

## 2016-01-20 ENCOUNTER — Telehealth: Payer: Self-pay | Admitting: *Deleted

## 2016-01-20 DIAGNOSIS — Z3041 Encounter for surveillance of contraceptive pills: Secondary | ICD-10-CM

## 2016-01-20 MED ORDER — LEVONORGEST-ETH ESTRAD 91-DAY 0.15-0.03 &0.01 MG PO TABS: 1.0000 | ORAL_TABLET | Freq: Every day | ORAL | 2 refills | Status: DC

## 2016-01-20 NOTE — Telephone Encounter (Signed)
Refill sent to pharmacy per Dr Hulan Fray order.

## 2016-01-20 NOTE — Telephone Encounter (Signed)
-----   Message from Francia Greaves sent at 01/20/2016  2:06 PM EST ----- Regarding: Refill  Contact: [phone] Left message w/ after hours answering service Needs a refill on OCP

## 2016-01-26 ENCOUNTER — Telehealth: Payer: Self-pay

## 2016-01-26 MED ORDER — LEVONORGEST-ETH ESTRAD 91-DAY 0.15-0.03 &0.01 MG PO TABS: 1.0000 | ORAL_TABLET | Freq: Every day | ORAL | 3 refills | Status: DC

## 2016-01-26 NOTE — Telephone Encounter (Signed)
Pt called and stated that due to her insurance can we change her seasonique to camrese and send to a new phamacy [place] [phone].

## 2016-01-26 NOTE — Telephone Encounter (Signed)
Generic [name] sent to pharmacy per pt request.

## 2016-06-21 ENCOUNTER — Ambulatory Visit: Payer: Managed Care, Other (non HMO) | Admitting: Primary Care

## 2016-06-28 ENCOUNTER — Ambulatory Visit: Payer: Managed Care, Other (non HMO) | Admitting: Primary Care

## 2016-06-28 ENCOUNTER — Ambulatory Visit (INDEPENDENT_AMBULATORY_CARE_PROVIDER_SITE_OTHER): Payer: 59 | Admitting: Primary Care

## 2016-06-28 ENCOUNTER — Encounter: Payer: Self-pay | Admitting: Primary Care

## 2016-06-28 VITALS — BP 126/78 | HR 73 | Temp 98.3°F | Ht 62.75 in | Wt 151.8 lb

## 2016-06-28 DIAGNOSIS — Z0000 Encounter for general adult medical examination without abnormal findings: Secondary | ICD-10-CM | POA: Insufficient documentation

## 2016-06-28 LAB — COMPREHENSIVE METABOLIC PANEL
ALK PHOS: 48 U/L (ref 39–117)
ALT: 23 U/L (ref 0–35)
AST: 14 U/L (ref 0–37)
Albumin: 4.3 g/dL (ref 3.5–5.2)
BUN: 12 mg/dL (ref 6–23)
CHLORIDE: 103 meq/L (ref 96–112)
CO2: 27 mEq/L (ref 19–32)
Calcium: 9.4 mg/dL (ref 8.4–10.5)
Creatinine, Ser: 0.58 mg/dL (ref 0.40–1.20)
GFR: 130.61 mL/min (ref >60.00)
GLUCOSE: 84 mg/dL (ref 70–99)
POTASSIUM: 4 meq/L (ref 3.5–5.1)
SODIUM: 136 meq/L (ref 135–145)
TOTAL PROTEIN: 7.4 g/dL (ref 6.0–8.3)
Total Bilirubin: 0.6 mg/dL (ref 0.2–1.2)

## 2016-06-28 LAB — LIPID PANEL
Cholesterol: 227 mg/dL — ABNORMAL HIGH (ref 0–200)
HDL: 42.4 mg/dL (ref >39.00)
LDL CALC: 161 mg/dL — AB (ref 0–99)
NONHDL: 184.94
Total CHOL/HDL Ratio: 5
Triglycerides: 121 mg/dL (ref 0.0–149.0)
VLDL: 24.2 mg/dL (ref 0.0–40.0)

## 2016-06-28 NOTE — Assessment & Plan Note (Signed)
Td UTD per patient, no records in [place] as she is from [place]. PAP UTD, following with GYN. Discussed to improve diet, increase veggies/fruit/whole grains. Continue exercise. Exam unremarkable. Labs pending. Follow up in 1 year.

## 2016-06-28 NOTE — Patient Instructions (Addendum)
Complete lab work prior to leaving today. I will notify you of your results once received.   It's important to improve your diet by reducing consumption of fast food, fried food, processed snack foods, sugary drinks. Increase consumption of fresh vegetables and fruits, whole grains, water.  Ensure you are drinking 64 ounces of water daily.  Continue exercising. You should be getting 150 minutes of moderate intensity exercise weekly.  Follow up in 1 year for your annual physical or sooner if needed.  It was a pleasure to meet you today! Please don't hesitate to call me with any questions. Welcome to [facility]!

## 2016-06-28 NOTE — Progress Notes (Signed)
Subjective:    Patient ID: Kimberly Cruz, female    DOB: 09/14/1985, 31 y.o.   MRN: [id number]  HPI  Kimberly Cruz is a 31 year old female who presents today to establish care and for complete physical.   Immunizations: -Tetanus: Unsure. Thinks she had one within 10 years. -Influenza: Did not complete last season  Diet: She endorses a fair diet Breakfast: Skips Lunch: Sometimes skips, sandwich (fast food) Dinner: Meat, vegetable, starch Snacks: None Desserts: 1-2 times weekly Beverages: Water, lemonade   Exercise: She exercises 3-4 times weekly for 1 hour Eye exam: Completed 1 year ago Dental exam: Completes semi-annually Pap Smear: Completed in 2017, normal   Review of Systems  Constitutional: Negative for unexpected weight change.  HENT: Negative for rhinorrhea.   Respiratory: Negative for cough and shortness of breath.   Cardiovascular: Negative for chest pain.  Gastrointestinal: Negative for constipation and diarrhea.  Genitourinary: Negative for difficulty urinating and menstrual problem.  Musculoskeletal: Negative for arthralgias and myalgias.  Skin: Negative for rash.       She has been evaluated by dermatology in the past and has had several spots removed.  Allergic/Immunologic: Negative for environmental allergies.  Neurological: Negative for dizziness, numbness and headaches.  Psychiatric/Behavioral:       Denies concerns for anxiety and depression       Past Medical History:  Diagnosis Date  . FHx: migraine headaches   . History of stomach ulcers   . Hyperlipidemia   . PONV (postoperative nausea and vomiting)   . Tonsil, abscess      Social History   Social History  . Marital status: Single    Spouse name: N/A  . Number of children: N/A  . Years of education: N/A   Occupational History  . Not on file.   Social History Main Topics  . Smoking status: Never Smoker  . Smokeless tobacco: Never Used  . Alcohol use Yes  . Drug use: No  . Sexual  activity: Yes    Birth control/ protection: Pill   Other Topics Concern  . Not on file   Social History Narrative   Single.   No children.   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    Enjoys hiking, camping, travel.    Past Surgical History:  Procedure Laterality Date  . ANKLE SURGERY  2000  . SEPTOPLASTY  06/2014  . TONSILLECTOMY N/A 07/17/2015   Procedure: TONSILLECTOMY;  Surgeon: Beverly Gust, MD;  Location: [place];  Service: ENT;  Laterality: N/A;    Family History  Problem Relation Age of Onset  . Diverticulitis Father   . Diabetes Maternal Grandmother   . Heart disease Maternal Grandmother   . Hypertension Maternal Grandmother   . Diverticulitis Paternal Grandmother   . Prostate cancer Maternal Grandfather     Allergies  Allergen Reactions  . Zithromax [Azithromycin] Swelling and Anaphylaxis    Current Outpatient Prescriptions on File Prior to Visit  Medication Sig Dispense Refill  . Levonorgestrel-Ethinyl Estradiol (AMETHIA,[name]) 0.15-0.03 &0.01 MG tablet Take 1 tablet by mouth daily. 3 Package 3   No current facility-administered medications on file prior to visit.     BP 126/78   Pulse 73   Temp 98.3 F (36.8 C) (Oral)   Ht 5' 2.75" (1.594 m)   Wt 151 lb 12.8 oz (68.9 kg)   SpO2 99%   BMI 27.10 kg/m    Objective:   Physical Exam  Constitutional: She is  oriented to person, place, and time. She appears well-nourished.  HENT:  Right Ear: Tympanic membrane and ear canal normal.  Left Ear: Tympanic membrane and ear canal normal.  Nose: Nose normal.  Mouth/Throat: Oropharynx is clear and moist.  Eyes: Conjunctivae and EOM are normal. Pupils are equal, round, and reactive to light.  Neck: Neck supple. No thyromegaly present.  Cardiovascular: Normal rate and regular rhythm.   No murmur heard. Pulmonary/Chest: Effort normal and breath sounds normal. She has no rales.  Abdominal: Soft. Bowel sounds are normal. There  is no tenderness.  Musculoskeletal: Normal range of motion.  Lymphadenopathy:    She has no cervical adenopathy.  Neurological: She is alert and oriented to person, place, and time. She has normal reflexes. No cranial nerve deficit.  Skin: Skin is warm and dry. No rash noted.  Psychiatric: She has a normal mood and affect.          Assessment & Plan:

## 2016-06-29 ENCOUNTER — Encounter: Payer: Self-pay | Admitting: *Deleted

## 2016-07-01 ENCOUNTER — Encounter: Payer: Self-pay | Admitting: Primary Care

## 2016-07-01 DIAGNOSIS — Z1283 Encounter for screening for malignant neoplasm of skin: Secondary | ICD-10-CM

## 2017-01-19 DIAGNOSIS — J209 Acute bronchitis, unspecified: Secondary | ICD-10-CM | POA: Diagnosis not present

## 2017-01-22 ENCOUNTER — Other Ambulatory Visit (HOSPITAL_COMMUNITY): Payer: Self-pay | Admitting: Obstetrics & Gynecology

## 2017-02-03 ENCOUNTER — Encounter: Payer: Self-pay | Admitting: Obstetrics & Gynecology

## 2017-02-03 ENCOUNTER — Ambulatory Visit (INDEPENDENT_AMBULATORY_CARE_PROVIDER_SITE_OTHER): Payer: 59 | Admitting: Obstetrics & Gynecology

## 2017-02-03 VITALS — BP 138/85 | HR 88 | Wt 162.0 lb

## 2017-02-03 DIAGNOSIS — Z124 Encounter for screening for malignant neoplasm of cervix: Secondary | ICD-10-CM | POA: Diagnosis not present

## 2017-02-03 DIAGNOSIS — Z1151 Encounter for screening for human papillomavirus (HPV): Secondary | ICD-10-CM

## 2017-02-03 DIAGNOSIS — Z113 Encounter for screening for infections with a predominantly sexual mode of transmission: Secondary | ICD-10-CM | POA: Diagnosis not present

## 2017-02-03 DIAGNOSIS — Z01419 Encounter for gynecological examination (general) (routine) without abnormal findings: Secondary | ICD-10-CM | POA: Diagnosis not present

## 2017-02-03 DIAGNOSIS — R87810 Cervical high risk human papillomavirus (HPV) DNA test positive: Secondary | ICD-10-CM | POA: Insufficient documentation

## 2017-02-03 NOTE — Progress Notes (Signed)
GYNECOLOGY ANNUAL PREVENTATIVE CARE ENCOUNTER NOTE  Subjective:   Kimberly Cruz is a 32 y.o. G0P0000 female here for a routine annual gynecologic exam.  Current complaints: occasional malodorous vaginal discharge. Desires vaginitis and STI evaluation.   Denies abnormal vaginal bleeding, discharge, pelvic pain, problems with intercourse or other gynecologic concerns.    Gynecologic History No LMP recorded. Patient is not currently having periods (Reason: Oral contraceptives). Contraception: OCP (estrogen/progesterone) Last Pap: 05/2015. Results were: normal  Obstetric History OB History  Gravida Para Term Preterm AB Living  0 0 0 0 0 0  SAB TAB Ectopic Multiple Live Births  0 0 0 0          Past Medical History:  Diagnosis Date  . FHx: migraine headaches   . History of stomach ulcers   . Hyperlipidemia   . PONV (postoperative nausea and vomiting)   . Tonsil, abscess     Past Surgical History:  Procedure Laterality Date  . ANKLE SURGERY  2000  . SEPTOPLASTY  06/2014  . TONSILLECTOMY N/A 07/17/2015   Procedure: TONSILLECTOMY;  Surgeon: Beverly Gust, MD;  Location: [place];  Service: ENT;  Laterality: N/A;    Current Outpatient Medications on File Prior to Visit  Medication Sig Dispense Refill  . ASHLYNA 0.15-0.03 &0.01 MG tablet TAKE ONE TABLET BY MOUTH ONCE DAILY 91 tablet 3   No current facility-administered medications on file prior to visit.     Allergies  Allergen Reactions  . Zithromax [Azithromycin] Swelling and Anaphylaxis    Social History   Socioeconomic History  . Marital status: Single    Spouse name: Not on file  . Number of children: Not on file  . Years of education: Not on file  . Highest education level: Not on file  Social Needs  . Financial resource strain: Not on file  . Food insecurity - worry: Not on file  . Food insecurity - inability: Not on file  . Transportation needs - medical: Not on file  . Transportation needs  - non-medical: Not on file  Occupational History  . Not on file  Tobacco Use  . Smoking status: Never Smoker  . Smokeless tobacco: Never Used  Substance and Sexual Activity  . Alcohol use: Yes  . Drug use: No  . Sexual activity: Yes    Birth control/protection: Pill  Other Topics Concern  . Not on file  Social History Narrative   Single.   No children.   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    Enjoys hiking, camping, travel.    Family History  Problem Relation Age of Onset  . Diverticulitis Father   . Diabetes Maternal Grandmother   . Heart disease Maternal Grandmother   . Hypertension Maternal Grandmother   . Diverticulitis Paternal Grandmother   . Prostate cancer Maternal Grandfather     The following portions of the patient's history were reviewed and updated as appropriate: allergies, current medications, past family history, past medical history, past social history, past surgical history and problem list.  Review of Systems Pertinent items noted in HPI and remainder of comprehensive ROS otherwise negative.   Objective:  BP 138/85   Pulse 88   Wt 162 lb (73.5 kg)   BMI 28.93 kg/m  CONSTITUTIONAL: Well-developed, well-nourished female in no acute distress.  HENT:  Normocephalic, atraumatic, External right and left ear normal. Oropharynx is clear and moist EYES: Conjunctivae and EOM are normal. Pupils are equal, round, and  reactive to light. No scleral icterus.  NECK: Normal range of motion, supple, no masses.  Normal thyroid.  SKIN: Skin is warm and dry. No rash noted. Not diaphoretic. No erythema. No pallor. NEUROLOGIC: Alert and oriented to person, place, and time. Normal reflexes, muscle tone coordination. No cranial nerve deficit noted. PSYCHIATRIC: Normal mood and affect. Normal behavior. Normal judgment and thought content. CARDIOVASCULAR: Normal heart rate noted, regular rhythm RESPIRATORY: Clear to auscultation bilaterally. Effort and  breath sounds normal, no problems with respiration noted. BREASTS: Symmetric in size. No masses, skin changes, nipple drainage, or lymphadenopathy. ABDOMEN: Soft, normal bowel sounds, no distention noted.  No tenderness, rebound or guarding.  PELVIC: Normal appearing external genitalia; normal appearing vaginal mucosa and cervix.  No abnormal discharge noted, testing sample obtained.  Pap smear obtained; there was significant bleeding after pap ameliorated by silver nitrate applied to external cervical canal.  Normal uterine size, no other palpable masses, no uterine or adnexal tenderness. MUSCULOSKELETAL: Normal range of motion. No tenderness.  No cyanosis, clubbing, or edema.  2+ distal pulses.   Assessment and Plan:  1. Encounter for gynecological examination with Papanicolaou smear of cervix - Cytology - PAP - Cervicovaginal ancillary only - HIV antibody (with reflex) - Hepatitis B surface antigen - Hepatitis C antibody - RPR Will follow up results of pap smear and manage accordingly. STI and discharge evaluation desired by patient. Routine preventative health maintenance measures emphasized. Please refer to After Visit Summary for other counseling recommendations.    Verita Schneiders, MD, [place], [place] for [facility]

## 2017-02-03 NOTE — Patient Instructions (Signed)
Preventive Care 18-39 Years, Female Preventive care refers to lifestyle choices and visits with your health care provider that can promote health and wellness. What does preventive care include?  A yearly physical exam. This is also called an annual well check.  Dental exams once or twice a year.  Routine eye exams. Ask your health care provider how often you should have your eyes checked.  Personal lifestyle choices, including: ? Daily care of your teeth and gums. ? Regular physical activity. ? Eating a healthy diet. ? Avoiding tobacco and drug use. ? Limiting alcohol use. ? Practicing safe sex. ? Taking vitamin and mineral supplements as recommended by your health care provider. What happens during an annual well check? The services and screenings done by your health care provider during your annual well check will depend on your age, overall health, lifestyle risk factors, and family history of disease. Counseling Your health care provider may ask you questions about your:  Alcohol use.  Tobacco use.  Drug use.  Emotional well-being.  Home and relationship well-being.  Sexual activity.  Eating habits.  Work and work Statistician.  Method of birth control.  Menstrual cycle.  Pregnancy history.  Screening You may have the following tests or measurements:  Height, weight, and BMI.  Diabetes screening. This is done by checking your blood sugar (glucose) after you have not eaten for a while (fasting).  Blood pressure.  Lipid and cholesterol levels. These may be checked every 5 years starting at age 66.  Skin check.  Hepatitis C blood test.  Hepatitis B blood test.  Sexually transmitted disease (STD) testing.  BRCA-related cancer screening. This may be done if you have a family history of breast, ovarian, tubal, or peritoneal cancers.  Pelvic exam and Pap test. This may be done every 3 years starting at age 40. Starting at age 59, this may be done every 5  years if you have a Pap test in combination with an HPV test.  Discuss your test results, treatment options, and if necessary, the need for more tests with your health care provider. Vaccines Your health care provider may recommend certain vaccines, such as:  Influenza vaccine. This is recommended every year.  Tetanus, diphtheria, and acellular pertussis (Tdap, Td) vaccine. You may need a Td booster every 10 years.  Varicella vaccine. You may need this if you have not been vaccinated.  HPV vaccine. If you are 69 or younger, you may need three doses over 6 months.  Measles, mumps, and rubella (MMR) vaccine. You may need at least one dose of MMR. You may also need a second dose.  Pneumococcal 13-valent conjugate (PCV13) vaccine. You may need this if you have certain conditions and were not previously vaccinated.  Pneumococcal polysaccharide (PPSV23) vaccine. You may need one or two doses if you smoke cigarettes or if you have certain conditions.  Meningococcal vaccine. One dose is recommended if you are age 27-21 years and a first-year college student living in a residence hall, or if you have one of several medical conditions. You may also need additional booster doses.  Hepatitis A vaccine. You may need this if you have certain conditions or if you travel or work in places where you may be exposed to hepatitis A.  Hepatitis B vaccine. You may need this if you have certain conditions or if you travel or work in places where you may be exposed to hepatitis B.  Haemophilus influenzae type b (Hib) vaccine. You may need this if  you have certain risk factors.  Talk to your health care provider about which screenings and vaccines you need and how often you need them. This information is not intended to replace advice given to you by your health care provider. Make sure you discuss any questions you have with your health care provider. Document Released: 03/01/2001 Document Revised: 09/23/2015  Document Reviewed: 11/04/2014 Elsevier Interactive Patient Education  [employer].

## 2017-02-04 LAB — HEPATITIS C ANTIBODY: Hep C Virus Ab: <0.1 s/co ratio (ref 0.0–0.9)

## 2017-02-04 LAB — HEPATITIS B SURFACE ANTIGEN: HEP B S AG: NEGATIVE

## 2017-02-04 LAB — HIV ANTIBODY (ROUTINE TESTING W REFLEX): HIV Screen 4th Generation wRfx: NONREACTIVE

## 2017-02-04 LAB — RPR: RPR: NONREACTIVE

## 2017-02-06 LAB — CERVICOVAGINAL ANCILLARY ONLY
Bacterial vaginitis: NEGATIVE
Candida vaginitis: NEGATIVE
Chlamydia: NEGATIVE
Neisseria Gonorrhea: NEGATIVE
Trichomonas: NEGATIVE

## 2017-02-08 LAB — CYTOLOGY - PAP
Diagnosis: NEGATIVE
HPV (WINDOPATH): DETECTED — AB
HPV 16/18/45 GENOTYPING: NEGATIVE

## 2017-02-10 ENCOUNTER — Encounter: Payer: Self-pay | Admitting: Obstetrics & Gynecology

## 2017-02-18 ENCOUNTER — Encounter: Payer: Self-pay | Admitting: Obstetrics & Gynecology

## 2017-02-20 ENCOUNTER — Other Ambulatory Visit: Payer: Self-pay

## 2017-02-20 MED ORDER — FLUCONAZOLE 150 MG PO TABS: 150.0000 mg | ORAL_TABLET | Freq: Once | ORAL | 1 refills | Status: AC

## 2017-02-20 NOTE — Telephone Encounter (Signed)
Patient is requesting a rx for diflucion.

## 2017-02-21 ENCOUNTER — Encounter: Payer: Self-pay | Admitting: Emergency Medicine

## 2017-02-21 ENCOUNTER — Emergency Department
Admission: EM | Admit: 2017-02-21 | Discharge: 2017-02-21 | Disposition: A | Discharge status: Home / Self Care | Payer: 59 | Attending: Emergency Medicine | Admitting: Emergency Medicine

## 2017-02-21 ENCOUNTER — Emergency Department: Payer: 59

## 2017-02-21 ENCOUNTER — Other Ambulatory Visit: Payer: Self-pay

## 2017-02-21 DIAGNOSIS — K529 Noninfective gastroenteritis and colitis, unspecified: Secondary | ICD-10-CM | POA: Diagnosis not present

## 2017-02-21 DIAGNOSIS — R1011 Right upper quadrant pain: Secondary | ICD-10-CM | POA: Diagnosis not present

## 2017-02-21 DIAGNOSIS — R1110 Vomiting, unspecified: Secondary | ICD-10-CM | POA: Diagnosis not present

## 2017-02-21 DIAGNOSIS — K5289 Other specified noninfective gastroenteritis and colitis: Secondary | ICD-10-CM | POA: Insufficient documentation

## 2017-02-21 DIAGNOSIS — R109 Unspecified abdominal pain: Secondary | ICD-10-CM | POA: Diagnosis not present

## 2017-02-21 DIAGNOSIS — R1084 Generalized abdominal pain: Secondary | ICD-10-CM | POA: Diagnosis not present

## 2017-02-21 DIAGNOSIS — R112 Nausea with vomiting, unspecified: Secondary | ICD-10-CM | POA: Diagnosis not present

## 2017-02-21 LAB — URINALYSIS, COMPLETE (UACMP) WITH MICROSCOPIC
Bacteria, UA: NONE SEEN
Bilirubin Urine: NEGATIVE
GLUCOSE, UA: NEGATIVE mg/dL
KETONES UR: NEGATIVE mg/dL
NITRITE: NEGATIVE
PROTEIN: NEGATIVE mg/dL
Specific Gravity, Urine: 1.013 (ref 1.005–1.030)
pH: 6 (ref 5.0–8.0)

## 2017-02-21 LAB — CBC WITH DIFFERENTIAL/PLATELET
BASOS ABS: 0 10*3/uL (ref 0–0.1)
BASOS ABS: 0 10*3/uL (ref 0–0.1)
Basophils Relative: 0 %
Basophils Relative: 0 %
EOS ABS: 0.3 10*3/uL (ref 0–0.7)
EOS PCT: 1 %
Eosinophils Absolute: 0.2 10*3/uL (ref 0–0.7)
Eosinophils Relative: 1 %
HCT: 46.5 % (ref 35.0–47.0)
HCT: 41.9 % (ref 35.0–47.0)
Hemoglobin: 15.7 g/dL (ref 12.0–16.0)
Hemoglobin: 14 g/dL (ref 12.0–16.0)
LYMPHS PCT: 5 %
Lymphocytes Relative: 4 %
Lymphs Abs: 1.5 10*3/uL (ref 1.0–3.6)
Lymphs Abs: 0.7 10*3/uL — ABNORMAL LOW (ref 1.0–3.6)
MCH: 30.1 pg (ref 26.0–34.0)
MCH: 30 pg (ref 26.0–34.0)
MCHC: 33.7 g/dL (ref 32.0–36.0)
MCHC: 33.4 g/dL (ref 32.0–36.0)
MCV: 89.3 fL (ref 80.0–100.0)
MCV: 89.8 fL (ref 80.0–100.0)
MONO ABS: 2.2 10*3/uL — AB (ref 0.2–0.9)
MONO ABS: 1 10*3/uL — AB (ref 0.2–0.9)
Monocytes Relative: 7 %
Monocytes Relative: 6 %
NEUTROS PCT: 87 %
Neutro Abs: 26.9 10*3/uL — ABNORMAL HIGH (ref 1.4–6.5)
Neutro Abs: 16.4 10*3/uL — ABNORMAL HIGH (ref 1.4–6.5)
Neutrophils Relative %: 89 %
PLATELETS: 378 10*3/uL (ref 150–440)
Platelets: 247 10*3/uL (ref 150–440)
RBC: 5.21 MIL/uL — AB (ref 3.80–5.20)
RBC: 4.66 MIL/uL (ref 3.80–5.20)
RDW: 13.5 % (ref 11.5–14.5)
RDW: 13 % (ref 11.5–14.5)
WBC: 30.9 10*3/uL — ABNORMAL HIGH (ref 3.6–11.0)
WBC: 18.3 10*3/uL — ABNORMAL HIGH (ref 3.6–11.0)

## 2017-02-21 LAB — COMPREHENSIVE METABOLIC PANEL
ALBUMIN: 3.8 g/dL (ref 3.5–5.0)
ALT: 20 U/L (ref 14–54)
AST: 37 U/L (ref 15–41)
Alkaline Phosphatase: 54 U/L (ref 38–126)
Anion gap: 16 — ABNORMAL HIGH (ref 5–15)
BUN: 24 mg/dL — ABNORMAL HIGH (ref 6–20)
CHLORIDE: 104 mmol/L (ref 101–111)
CO2: 20 mmol/L — AB (ref 22–32)
CREATININE: 0.81 mg/dL (ref 0.44–1.00)
Calcium: 8.7 mg/dL — ABNORMAL LOW (ref 8.9–10.3)
GFR calc non Af Amer: >60 mL/min (ref >60)
GLUCOSE: 122 mg/dL — AB (ref 65–99)
Potassium: 3.8 mmol/L (ref 3.5–5.1)
SODIUM: 140 mmol/L (ref 135–145)
Total Bilirubin: 0.9 mg/dL (ref 0.3–1.2)
Total Protein: 7.3 g/dL (ref 6.5–8.1)

## 2017-02-21 LAB — LIPASE, BLOOD: Lipase: 26 U/L (ref 11–51)

## 2017-02-21 MED ORDER — IOPAMIDOL (ISOVUE-300) INJECTION 61%
100.0000 mL | Freq: Once | INTRAVENOUS | Status: AC | PRN
  Administered 2017-02-21: 100 mL via INTRAVENOUS

## 2017-02-21 MED ORDER — DICYCLOMINE HCL 20 MG PO TABS: 20.0000 mg | ORAL_TABLET | Freq: Three times a day (TID) | ORAL | 0 refills | Status: DC | PRN

## 2017-02-21 MED ORDER — IOPAMIDOL (ISOVUE-300) INJECTION 61%
30.0000 mL | Freq: Once | INTRAVENOUS | Status: AC | PRN
  Administered 2017-02-21: 30 mL via ORAL

## 2017-02-21 MED ORDER — ONDANSETRON HCL 4 MG/2ML IJ SOLN
4.0000 mg | Freq: Once | INTRAMUSCULAR | Status: AC
  Administered 2017-02-21: 4 mg via INTRAVENOUS
  Filled 2017-02-21: qty 2

## 2017-02-21 MED ORDER — MORPHINE SULFATE (PF) 4 MG/ML IV SOLN
4.0000 mg | Freq: Once | INTRAVENOUS | Status: DC
  Filled 2017-02-21: qty 1

## 2017-02-21 MED ORDER — ONDANSETRON 4 MG PO TBDP: 4.0000 mg | ORAL_TABLET | Freq: Three times a day (TID) | ORAL | 0 refills | Status: DC | PRN

## 2017-02-21 MED ORDER — SODIUM CHLORIDE 0.9 % IV SOLN
Freq: Once | INTRAVENOUS | Status: AC
Start: 2017-02-21 — End: 2017-02-21
  Administered 2017-02-21: 1000 mL via INTRAVENOUS

## 2017-02-21 MED ORDER — SODIUM CHLORIDE 0.9 % IV SOLN
Freq: Once | INTRAVENOUS | Status: AC
  Administered 2017-02-21: 1000 mL via INTRAVENOUS

## 2017-02-21 NOTE — ED Notes (Signed)
Urine POC negative. 

## 2017-02-21 NOTE — ED Notes (Signed)
Patient transported to CT 

## 2017-02-21 NOTE — ED Notes (Signed)
Patient transported to Ultrasound 

## 2017-02-21 NOTE — ED Provider Notes (Signed)
[facility] Emergency Department Provider Note       Time seen: ----------------------------------------- 8:05 AM on 02/21/2017 -----------------------------------------   I have reviewed the triage vital signs and the nursing notes.  HISTORY   Chief Complaint Abdominal Cramping and Abdominal Pain    HPI Kimberly Cruz is a 32 y.o. female with a history of migraines, stomach ulcers, hyperlipidemia who presents to the ED for sudden onset right-sided abdominal pain with nausea, vomiting and diarrhea.  She has had subjective fever as well.  She arrives actively vomiting.  Patient states she has had intractable vomiting prior to arrival, she has had some diarrhea.  Pain is 5 out of 10 in the right side of her abdomen.  Nothing makes it better.  Past Medical History:  Diagnosis Date  . FHx: migraine headaches   . History of stomach ulcers   . Hyperlipidemia   . PONV (postoperative nausea and vomiting)   . Tonsil, abscess     Patient Active Problem List   Diagnosis Date Noted  . Cervical high risk HPV (human papillomavirus) test positive 02/03/2017    Past Surgical History:  Procedure Laterality Date  . ANKLE SURGERY  2000  . SEPTOPLASTY  06/2014  . TONSILLECTOMY N/A 07/17/2015   Procedure: TONSILLECTOMY;  Surgeon: Beverly Gust, MD;  Location: [place];  Service: ENT;  Laterality: N/A;    Allergies Zithromax [azithromycin]  Social History Social History   Tobacco Use  . Smoking status: Never Smoker  . Smokeless tobacco: Never Used  Substance Use Topics  . Alcohol use: Yes  . Drug use: No    Review of Systems Constitutional: Positive for fever Eyes: Negative for vision changes ENT:  Negative for congestion, sore throat Cardiovascular: Negative for chest pain. Respiratory: Negative for shortness of breath. Gastrointestinal: Positive for abdominal pain, vomiting and diarrhea Genitourinary: Negative for  dysuria. Musculoskeletal: Negative for back pain. Skin: Negative for rash. Neurological: Negative for headaches, focal weakness or numbness.  All systems negative/normal/unremarkable except as stated in the HPI  ____________________________________________   PHYSICAL EXAM:  VITAL SIGNS: ED Triage Vitals  Enc Vitals Group     BP 02/21/17 0718 (!) 122/94     Pulse Rate 02/21/17 0718 89     Resp 02/21/17 0718 20     Temp --      Temp src --      SpO2 02/21/17 0718 97 %     Weight --      Height --      Head Circumference --      Peak Flow --      Pain Score 02/21/17 0717 4     Pain Loc --      Pain Edu? --      Excl. in [place]? --     Constitutional: Alert and oriented.  Mild distress Eyes: Conjunctivae are normal. Normal extraocular movements. ENT   Head: Normocephalic and atraumatic.   Nose: No congestion/rhinnorhea.   Mouth/Throat: Mucous membranes are moist.   Neck: No stridor. Cardiovascular: Normal rate, regular rhythm. No murmurs, rubs, or gallops. Respiratory: Normal respiratory effort without tachypnea nor retractions. Breath sounds are clear and equal bilaterally. No wheezes/rales/rhonchi. Gastrointestinal: Right-sided abdominal tenderness, no rebound or guarding.  Normal bowel sounds. Musculoskeletal: Nontender with normal range of motion in extremities. No lower extremity tenderness nor edema. Neurologic:  Normal speech and language. No gross focal neurologic deficits are appreciated.  Skin:  Skin is warm, dry and intact. No rash noted. Psychiatric:  Mood and affect are normal. Speech and behavior are normal.  ____________________________________________  ED COURSE:  As part of my medical decision making, I reviewed the following data within the [place] History obtained from family if available, nursing notes, old chart and ekg, as well as notes from prior ED visits. Patient presented for abdominal pain, vomiting and diarrhea, we  will assess with labs and imaging as indicated at this time.   Procedures ____________________________________________   LABS (pertinent positives/negatives)  Labs Reviewed  CBC WITH DIFFERENTIAL/PLATELET - Abnormal; Notable for the following components:      Result Value   WBC 30.9 (*)    RBC 5.21 (*)    Neutro Abs 26.9 (*)    Monocytes Absolute 2.2 (*)    All other components within normal limits  COMPREHENSIVE METABOLIC PANEL - Abnormal; Notable for the following components:   CO2 20 (*)    Glucose, Bld 122 (*)    BUN 24 (*)    Calcium 8.7 (*)    Anion gap 16 (*)    All other components within normal limits  URINALYSIS, COMPLETE (UACMP) WITH MICROSCOPIC - Abnormal; Notable for the following components:   Color, Urine YELLOW (*)    APPearance HAZY (*)    Hgb urine dipstick MODERATE (*)    Leukocytes, UA TRACE (*)    Squamous Epithelial / LPF 6-30 (*)    All other components within normal limits  CBC WITH DIFFERENTIAL/PLATELET - Abnormal; Notable for the following components:   WBC 18.3 (*)    Neutro Abs 16.4 (*)    Lymphs Abs 0.7 (*)    Monocytes Absolute 1.0 (*)    All other components within normal limits  LIPASE, BLOOD  POC URINE PREG, ED    RADIOLOGY Images were viewed by me  Right upper quadrant ultrasound IMPRESSION: No acute findings in the right upper quadrant.  Apparent increased echotexture within the right kidney, question chronic medical renal disease. Recommend clinical correlation. IMPRESSION: 1. Spleen slightly prominent without focal splenic lesion.  2. Sigmoid diverticula without diverticulitis. Proximal duodenum diverticulum, uncomplicated. No bowel obstruction or bowel wall thickening. No abscess. Appendix appears normal.  3. No renal or ureteral calculus. No hydronephrosis. ____________________________________________  DIFFERENTIAL DIAGNOSIS   Gastroenteritis, dehydration, electrolyte abnormality, pregnancy, peptic ulcer disease,  cholecystitis, biliary colic, renal colic  FINAL ASSESSMENT AND PLAN  Abdominal pain, vomiting and diarrhea   Plan: Patient had presented for symptoms of gastroenteritis. Patient's labs revealed market leukocytosis that improved over her ER stay.  We gave her fluids as well as antiemetics and pain medicine.  CT was negative for any acute intra-abdominal process.  This is likely viral gastroenteritis.  She will be treated with antiemetics and antispasmodic agents.  She is cleared for outpatient follow-up.   Laurence Aly, MD   Note: This note was generated in part or whole with voice recognition software. Voice recognition is usually quite accurate but there are transcription errors that can and very often do occur. I apologize for any typographical errors that were not detected and corrected.     Earleen Newport, MD 02/21/17 [phone]

## 2017-02-21 NOTE — ED Triage Notes (Addendum)
Presents with sudden onset of right side abd with n/v  And subjective fever  Pt is actively vomiting in triage

## 2017-02-21 NOTE — ED Notes (Signed)
Pt states she is unable to urinate at this time; will attempt for urine specimen post IVF.

## 2017-06-20 ENCOUNTER — Other Ambulatory Visit: Payer: Self-pay | Admitting: Primary Care

## 2017-06-20 DIAGNOSIS — Z0000 Encounter for general adult medical examination without abnormal findings: Secondary | ICD-10-CM

## 2017-06-29 ENCOUNTER — Other Ambulatory Visit: Payer: 59

## 2017-06-30 ENCOUNTER — Encounter: Payer: 59 | Admitting: Primary Care

## 2017-07-03 ENCOUNTER — Encounter: Payer: 59 | Admitting: Primary Care

## 2017-07-24 ENCOUNTER — Other Ambulatory Visit: Payer: Self-pay

## 2017-07-24 NOTE — Telephone Encounter (Signed)
Refill on ashlyna called into [facility]

## 2017-08-25 DIAGNOSIS — D2262 Melanocytic nevi of left upper limb, including shoulder: Secondary | ICD-10-CM | POA: Diagnosis not present

## 2017-08-25 DIAGNOSIS — D2261 Melanocytic nevi of right upper limb, including shoulder: Secondary | ICD-10-CM | POA: Diagnosis not present

## 2017-08-25 DIAGNOSIS — D2271 Melanocytic nevi of right lower limb, including hip: Secondary | ICD-10-CM | POA: Diagnosis not present

## 2017-11-29 ENCOUNTER — Encounter: Payer: Self-pay | Admitting: Radiology

## 2017-12-01 ENCOUNTER — Ambulatory Visit (INDEPENDENT_AMBULATORY_CARE_PROVIDER_SITE_OTHER): Payer: 59 | Admitting: Primary Care

## 2017-12-01 ENCOUNTER — Encounter: Payer: Self-pay | Admitting: Primary Care

## 2017-12-01 VITALS — BP 122/80 | HR 74 | Temp 98.1°F | Ht 62.75 in | Wt 167.0 lb

## 2017-12-01 DIAGNOSIS — R87810 Cervical high risk human papillomavirus (HPV) DNA test positive: Secondary | ICD-10-CM

## 2017-12-01 DIAGNOSIS — Z0000 Encounter for general adult medical examination without abnormal findings: Secondary | ICD-10-CM | POA: Diagnosis not present

## 2017-12-01 DIAGNOSIS — E042 Nontoxic multinodular goiter: Secondary | ICD-10-CM | POA: Insufficient documentation

## 2017-12-01 DIAGNOSIS — Z23 Encounter for immunization: Secondary | ICD-10-CM

## 2017-12-01 DIAGNOSIS — R5383 Other fatigue: Secondary | ICD-10-CM | POA: Diagnosis not present

## 2017-12-01 NOTE — Assessment & Plan Note (Signed)
Following with GYN, repeat Pap smear due in January 2020.

## 2017-12-01 NOTE — Assessment & Plan Note (Signed)
Could be secondary to her busy work schedule.  Will check TSH, vitamin B12, vitamin D, CBC.

## 2017-12-01 NOTE — Assessment & Plan Note (Signed)
Tetanus due, provided today.  Declines influenza vaccination. Pap smear up-to-date, following with GYN and is due in January 2020 for repeat Pap. Commended her on regular exercise, encouraged to continue.  Discussed to work on diet, less fast food/restaurant food. Exam today unremarkable. Labs pending.  She will return when fasting. Follow-up in 1 year for CPE.

## 2017-12-01 NOTE — Progress Notes (Signed)
Subjective:    Patient ID: Kimberly Cruz, female    DOB: 1985/04/16, 32 y.o.   MRN: [id number]  HPI  Kimberly Cruz is a 32 year old female who presents today for complete physical.  Always feeling tired. Wakes up feeling "exhausted". She was once told in the past that she had thyroid nodules, underwent biopsy several times during early teenage years. Last ultrasound was in 2015 in [place], [place]. She thinks her nodules were all benign at that point. Denies history of hypothyroidism.   She does work two jobs and is constantly busy.   Immunizations: -Tetanus: Unsure, due today. -Influenza: Declines   Diet: She endorses a fair diet.  Breakfast: Skips Lunch: Fast food, restaurants  Dinner: Left overs from lunch Snacks: None Desserts: 2-3 times monthly  Beverages: Little water, juice, occasional alcohol   Exercise: She is working out at the gym 2-4 times weekly Eye exam: Completed this year Dental exam: Completes semi-annually  Pap Smear: Completed in January 2019   Review of Systems  Constitutional: Positive for fatigue. Negative for unexpected weight change.  HENT: Negative for rhinorrhea.   Respiratory: Negative for cough and shortness of breath.   Cardiovascular: Negative for chest pain.  Gastrointestinal: Negative for constipation and diarrhea.  Genitourinary: Negative for difficulty urinating and menstrual problem.  Musculoskeletal: Negative for arthralgias and myalgias.  Skin: Negative for rash.  Allergic/Immunologic: Negative for environmental allergies.  Neurological: Negative for dizziness, numbness and headaches.  Psychiatric/Behavioral: The patient is not nervous/anxious.        Past Medical History:  Diagnosis Date  . FHx: migraine headaches   . History of stomach ulcers   . Hyperlipidemia   . PONV (postoperative nausea and vomiting)   . Tonsil, abscess      Social History   Socioeconomic History  . Marital status: Single    Spouse name: Not on file  .  Number of children: Not on file  . Years of education: Not on file  . Highest education level: Not on file  Occupational History  . Not on file  Social Needs  . Financial resource strain: Not on file  . Food insecurity:    Worry: Not on file    Inability: Not on file  . Transportation needs:    Medical: Not on file    Non-medical: Not on file  Tobacco Use  . Smoking status: Never Smoker  . Smokeless tobacco: Never Used  Substance and Sexual Activity  . Alcohol use: Yes  . Drug use: No  . Sexual activity: Yes    Birth control/protection: Pill  Lifestyle  . Physical activity:    Days per week: Not on file    Minutes per session: Not on file  . Stress: Not on file  Relationships  . Social connections:    Talks on phone: Not on file    Gets together: Not on file    Attends religious service: Not on file    Active member of club or organization: Not on file    Attends meetings of clubs or organizations: Not on file    Relationship status: Not on file  . Intimate partner violence:    Fear of current or ex partner: Not on file    Emotionally abused: Not on file    Physically abused: Not on file    Forced sexual activity: Not on file  Other Topics Concern  . Not on file  Social History Narrative   Single.   No children.  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    Enjoys hiking, camping, travel.    Past Surgical History:  Procedure Laterality Date  . ANKLE SURGERY  2000  . SEPTOPLASTY  06/2014  . TONSILLECTOMY N/A 07/17/2015   Procedure: TONSILLECTOMY;  Surgeon: Beverly Gust, MD;  Location: [place];  Service: ENT;  Laterality: N/A;    Family History  Problem Relation Age of Onset  . Diverticulitis Father   . Diabetes Maternal Grandmother   . Heart disease Maternal Grandmother   . Hypertension Maternal Grandmother   . Diverticulitis Paternal Grandmother   . Prostate cancer Maternal Grandfather     Allergies  Allergen Reactions    . Zithromax [Azithromycin] Swelling and Anaphylaxis    Current Outpatient Medications on File Prior to Visit  Medication Sig Dispense Refill  . ASHLYNA 0.15-0.03 &0.01 MG tablet TAKE ONE TABLET BY MOUTH ONCE DAILY 91 tablet 3   No current facility-administered medications on file prior to visit.     BP 122/80   Pulse 74   Temp 98.1 F (36.7 C) (Oral)   Ht 5' 2.75" (1.594 m)   Wt 167 lb (75.8 kg)   SpO2 98%   BMI 29.82 kg/m    Objective:   Physical Exam  Constitutional: She is oriented to person, place, and time. She appears well-nourished.  HENT:  Mouth/Throat: No oropharyngeal exudate.  Eyes: Pupils are equal, round, and reactive to light. EOM are normal.  Neck: Neck supple. No thyromegaly present.  Cardiovascular: Normal rate and regular rhythm.  Respiratory: Effort normal and breath sounds normal.  GI: Soft. Bowel sounds are normal. There is no tenderness.  Musculoskeletal: Normal range of motion.  Neurological: She is alert and oriented to person, place, and time.  Skin: Skin is warm and dry.  Psychiatric: She has a normal mood and affect.           Assessment & Plan:

## 2017-12-01 NOTE — Patient Instructions (Signed)
You will be contacted regarding your ultrasound.  Please let us know if you have not been contacted within one week.   Schedule a lab only appointment to return when you are fasting. Make sure to fast 4 hours prior to your appointment.  Continue exercising. You should be getting 150 minutes of moderate intensity exercise weekly.  It's important to improve your diet by reducing consumption of fast food, fried food, processed snack foods, sugary drinks. Increase consumption of fresh vegetables and fruits, whole grains, water.  Ensure you are drinking 64 ounces of water daily.  It was a pleasure to see you today!   Preventive Care 18-39 Years, Female Preventive care refers to lifestyle choices and visits with your health care provider that can promote health and wellness. What does preventive care include?  A yearly physical exam. This is also called an annual well check.  Dental exams once or twice a year.  Routine eye exams. Ask your health care provider how often you should have your eyes checked.  Personal lifestyle choices, including: ? Daily care of your teeth and gums. ? Regular physical activity. ? Eating a healthy diet. ? Avoiding tobacco and drug use. ? Limiting alcohol use. ? Practicing safe sex. ? Taking vitamin and mineral supplements as recommended by your health care provider. What happens during an annual well check? The services and screenings done by your health care provider during your annual well check will depend on your age, overall health, lifestyle risk factors, and family history of disease. Counseling Your health care provider may ask you questions about your:  Alcohol use.  Tobacco use.  Drug use.  Emotional well-being.  Home and relationship well-being.  Sexual activity.  Eating habits.  Work and work Statistician.  Method of birth control.  Menstrual cycle.  Pregnancy history.  Screening You may have the following tests or  measurements:  Height, weight, and BMI.  Diabetes screening. This is done by checking your blood sugar (glucose) after you have not eaten for a while (fasting).  Blood pressure.  Lipid and cholesterol levels. These may be checked every 5 years starting at age 27.  Skin check.  Hepatitis C blood test.  Hepatitis B blood test.  Sexually transmitted disease (STD) testing.  BRCA-related cancer screening. This may be done if you have a family history of breast, ovarian, tubal, or peritoneal cancers.  Pelvic exam and Pap test. This may be done every 3 years starting at age 57. Starting at age 49, this may be done every 5 years if you have a Pap test in combination with an HPV test.  Discuss your test results, treatment options, and if necessary, the need for more tests with your health care provider. Vaccines Your health care provider may recommend certain vaccines, such as:  Influenza vaccine. This is recommended every year.  Tetanus, diphtheria, and acellular pertussis (Tdap, Td) vaccine. You may need a Td booster every 10 years.  Varicella vaccine. You may need this if you have not been vaccinated.  HPV vaccine. If you are 21 or younger, you may need three doses over 6 months.  Measles, mumps, and rubella (MMR) vaccine. You may need at least one dose of MMR. You may also need a second dose.  Pneumococcal 13-valent conjugate (PCV13) vaccine. You may need this if you have certain conditions and were not previously vaccinated.  Pneumococcal polysaccharide (PPSV23) vaccine. You may need one or two doses if you smoke cigarettes or if you have certain conditions.  Meningococcal vaccine. One dose is recommended if you are age 12-21 years and a first-year college student living in a residence hall, or if you have one of several medical conditions. You may also need additional booster doses.  Hepatitis A vaccine. You may need this if you have certain conditions or if you travel or work  in places where you may be exposed to hepatitis A.  Hepatitis B vaccine. You may need this if you have certain conditions or if you travel or work in places where you may be exposed to hepatitis B.  Haemophilus influenzae type b (Hib) vaccine. You may need this if you have certain risk factors.  Talk to your health care provider about which screenings and vaccines you need and how often you need them. This information is not intended to replace advice given to you by your health care provider. Make sure you discuss any questions you have with your health care provider. Document Released: 03/01/2001 Document Revised: 09/23/2015 Document Reviewed: 11/04/2014 Elsevier Interactive Patient Education  [employer].

## 2017-12-01 NOTE — Addendum Note (Signed)
Addended by: Jacqualin Combes on: 12/01/2017 04:43 PM   Modules accepted: Orders

## 2017-12-01 NOTE — Assessment & Plan Note (Signed)
Repeat thyroid ultrasound and TSH pending. Perhaps small nodule to left side of gland. Non tender.

## 2017-12-05 ENCOUNTER — Other Ambulatory Visit (INDEPENDENT_AMBULATORY_CARE_PROVIDER_SITE_OTHER): Payer: 59

## 2017-12-05 ENCOUNTER — Telehealth: Payer: Self-pay | Admitting: Primary Care

## 2017-12-05 DIAGNOSIS — Z0000 Encounter for general adult medical examination without abnormal findings: Secondary | ICD-10-CM

## 2017-12-05 DIAGNOSIS — R5383 Other fatigue: Secondary | ICD-10-CM | POA: Diagnosis not present

## 2017-12-05 DIAGNOSIS — E042 Nontoxic multinodular goiter: Secondary | ICD-10-CM

## 2017-12-05 LAB — COMPREHENSIVE METABOLIC PANEL
ALK PHOS: 58 U/L (ref 39–117)
ALT: 17 U/L (ref 0–35)
AST: 14 U/L (ref 0–37)
Albumin: 3.9 g/dL (ref 3.5–5.2)
BUN: 10 mg/dL (ref 6–23)
CO2: 26 mEq/L (ref 19–32)
CREATININE: 0.6 mg/dL (ref 0.40–1.20)
Calcium: 8.8 mg/dL (ref 8.4–10.5)
Chloride: 103 mEq/L (ref 96–112)
GFR: 124.37 mL/min (ref >60.00)
GLUCOSE: 92 mg/dL (ref 70–99)
Potassium: 4.2 mEq/L (ref 3.5–5.1)
Sodium: 137 mEq/L (ref 135–145)
TOTAL PROTEIN: 6.9 g/dL (ref 6.0–8.3)
Total Bilirubin: 0.4 mg/dL (ref 0.2–1.2)

## 2017-12-05 LAB — LIPID PANEL
Cholesterol: 187 mg/dL (ref 0–200)
HDL: 43.4 mg/dL (ref >39.00)
LDL Cholesterol: 107 mg/dL — ABNORMAL HIGH (ref 0–99)
NONHDL: 143.36
Total CHOL/HDL Ratio: 4
Triglycerides: 182 mg/dL — ABNORMAL HIGH (ref 0.0–149.0)
VLDL: 36.4 mg/dL (ref 0.0–40.0)

## 2017-12-05 LAB — VITAMIN D 25 HYDROXY (VIT D DEFICIENCY, FRACTURES): VITD: 36.63 ng/mL (ref 30.00–100.00)

## 2017-12-05 LAB — CBC
HCT: 41.3 % (ref 36.0–46.0)
Hemoglobin: 14 g/dL (ref 12.0–15.0)
MCHC: 33.9 g/dL (ref 30.0–36.0)
MCV: 89.6 fl (ref 78.0–100.0)
Platelets: 285 10*3/uL (ref 150.0–400.0)
RBC: 4.61 Mil/uL (ref 3.87–5.11)
RDW: 12.8 % (ref 11.5–15.5)
WBC: 8 10*3/uL (ref 4.0–10.5)

## 2017-12-05 LAB — TSH: TSH: 1.99 u[IU]/mL (ref 0.35–4.50)

## 2017-12-05 LAB — VITAMIN B12: VITAMIN B 12: 273 pg/mL (ref 211–911)

## 2017-12-05 NOTE — Telephone Encounter (Signed)
Kimberly Cruz, could you help this patient?

## 2017-12-05 NOTE — Telephone Encounter (Signed)
Pt called in wanting to know where she would be getting scheduled for her thyroid ultrasound. Pt insisted she would call herself and schedule. Please advise

## 2017-12-06 NOTE — Telephone Encounter (Signed)
Called patient and she has been scheduled at [place] and aware.

## 2017-12-22 ENCOUNTER — Ambulatory Visit
Admission: RE | Admit: 2017-12-22 | Discharge: 2017-12-22 | Disposition: A | Discharge status: Home / Self Care | Payer: 59 | Source: Ambulatory Visit | Attending: Primary Care | Admitting: Primary Care

## 2017-12-22 DIAGNOSIS — E041 Nontoxic single thyroid nodule: Secondary | ICD-10-CM | POA: Diagnosis not present

## 2018-03-23 ENCOUNTER — Telehealth: Payer: Self-pay

## 2018-03-23 ENCOUNTER — Encounter: Payer: Self-pay | Admitting: Family Medicine

## 2018-03-23 ENCOUNTER — Ambulatory Visit: Payer: 59 | Admitting: Family Medicine

## 2018-03-23 DIAGNOSIS — K2900 Acute gastritis without bleeding: Secondary | ICD-10-CM

## 2018-03-23 DIAGNOSIS — K2970 Gastritis, unspecified, without bleeding: Secondary | ICD-10-CM

## 2018-03-23 HISTORY — DX: Gastritis, unspecified, without bleeding: K29.70

## 2018-03-23 MED ORDER — PANTOPRAZOLE SODIUM 40 MG PO TBEC: 40.0000 mg | DELAYED_RELEASE_TABLET | Freq: Every day | ORAL | 1 refills | Status: DC

## 2018-03-23 NOTE — Assessment & Plan Note (Signed)
Most likely GERD/ gastritis Less liekly PUD, doubt pancreatitis.  Start PPI, decrease acid in diet.Marland Kitchen if not improving consider lab eval with CMET, lipase, cbc etc.

## 2018-03-23 NOTE — Addendum Note (Signed)
Addended by: Eliezer Lofts E on: 03/23/2018 03:52 PM   Modules accepted: Orders

## 2018-03-23 NOTE — Patient Instructions (Signed)
Work on low acid diet.Marland Kitchen stop oranges, tomatos, soda, caffeine. Decrease stress, increase water intake. Start pantoprazole 40 mg daily. Plan to continue 4 weeks, but call if symptoms are not improving 2 weeks.

## 2018-03-23 NOTE — Telephone Encounter (Signed)
Pt said 03/22/18 at around 1PM pt noticed an achy uncomfortable feeling in lower rib cage area under bra line. Has continued thru out the night.Not really a pain but uncomfortable feeling.pt also feels like she has heartburn. NO CP or SOB. Pt would like to be seen today after 1 PM. Pt scheduled appt 03/23/18 at 3:15 with Dr Diona Browner. If pt condition changes or worsens prior to appt pt will go to [facility] or ED. FYI to Dr Diona Browner.

## 2018-03-23 NOTE — Progress Notes (Signed)
Subjective:    Patient ID: Kimberly Cruz, female    DOB: September 21, 1985, 33 y.o.   MRN: [id number]  HPI   33 year old female pt of Tawni Millers with high cholesterol and history of stomach ulcers presents with new onset achiness under left breast, burning feeling in chest and , left upper quadrant pain.   She reports sudden onset sharp pain in left anterior chest, momentary that changed into achiness under left breast.  Went home.. went layed on back felt uncomfortable like pressure in left chest and Left Upper abdomen. No change with eating.  Has improved this Am, but now central chest burning. No sour taste in thrt, oano nausea, emesis.  No diarrhea  NO SOB, no fatigue   Not sure what to take to treat. Has been stress at work.. not eating as healthy.. more pizza.  3 days ago.. did take motrin PM, not taking on regular basis  Has been eating daily oranges.  nonsmoker  Family histry : no early history of MI.  Social History /Family History/Past Medical History reviewed in detail and updated in EMR if needed. Vitals:   03/23/18 1503  BP: 110/72  Pulse: 79  Temp: 98.6 F (37 C)  SpO2: 97%    Review of Systems  Constitutional: Negative for fatigue and fever.  HENT: Negative for congestion.   Eyes: Negative for pain.  Respiratory: Negative for cough and shortness of breath.   Cardiovascular: Negative for chest pain, palpitations and leg swelling.  Gastrointestinal: Negative for abdominal pain.  Genitourinary: Negative for dysuria and vaginal bleeding.  Musculoskeletal: Negative for back pain.  Neurological: Negative for syncope, light-headedness and headaches.  Psychiatric/Behavioral: Negative for dysphoric mood.       Objective:   Physical Exam Constitutional:      General: She is not in acute distress.    Appearance: Normal appearance. She is well-developed. She is not ill-appearing or toxic-appearing.  HENT:     Head: Normocephalic.     Right Ear: Hearing, tympanic  membrane, ear canal and external ear normal. Tympanic membrane is not erythematous, retracted or bulging.     Left Ear: Hearing, tympanic membrane, ear canal and external ear normal. Tympanic membrane is not erythematous, retracted or bulging.     Nose: No mucosal edema or rhinorrhea.     Right Sinus: No maxillary sinus tenderness or frontal sinus tenderness.     Left Sinus: No maxillary sinus tenderness or frontal sinus tenderness.     Mouth/Throat:     Pharynx: Uvula midline.  Eyes:     General: Lids are normal. Lids are everted, no foreign bodies appreciated.     Conjunctiva/sclera: Conjunctivae normal.     Pupils: Pupils are equal, round, and reactive to light.  Neck:     Musculoskeletal: Normal range of motion and neck supple.     Thyroid: No thyroid mass or thyromegaly.     Vascular: No carotid bruit.     Trachea: Trachea normal.  Cardiovascular:     Rate and Rhythm: Normal rate and regular rhythm.     Pulses: Normal pulses.     Heart sounds: Normal heart sounds, S1 normal and S2 normal. No murmur. No friction rub. No gallop.   Pulmonary:     Effort: Pulmonary effort is normal. No tachypnea or respiratory distress.     Breath sounds: Normal breath sounds. No decreased breath sounds, wheezing, rhonchi or rales.  Abdominal:     General: Bowel sounds are normal.  Palpations: Abdomen is soft.     Tenderness: There is abdominal tenderness in the epigastric area.       Comments:  Soreness of left anterior rib cage and burning in central chest  Skin:    General: Skin is warm and dry.     Findings: No rash.  Neurological:     Mental Status: She is alert.  Psychiatric:        Mood and Affect: Mood is not anxious or depressed.        Speech: Speech normal.        Behavior: Behavior normal. Behavior is cooperative.        Thought Content: Thought content normal.        Judgment: Judgment normal.           Assessment & Plan:

## 2018-04-11 ENCOUNTER — Other Ambulatory Visit: Payer: Self-pay

## 2018-04-11 ENCOUNTER — Encounter: Payer: Self-pay | Admitting: Family Medicine

## 2018-04-11 ENCOUNTER — Telehealth (INDEPENDENT_AMBULATORY_CARE_PROVIDER_SITE_OTHER): Payer: 59 | Admitting: Family Medicine

## 2018-04-11 DIAGNOSIS — R05 Cough: Secondary | ICD-10-CM | POA: Diagnosis not present

## 2018-04-11 DIAGNOSIS — R0789 Other chest pain: Secondary | ICD-10-CM | POA: Diagnosis not present

## 2018-04-11 DIAGNOSIS — Z20828 Contact with and (suspected) exposure to other viral communicable diseases: Secondary | ICD-10-CM | POA: Diagnosis not present

## 2018-04-11 DIAGNOSIS — R059 Cough, unspecified: Secondary | ICD-10-CM

## 2018-04-11 MED ORDER — ALBUTEROL SULFATE HFA 108 (90 BASE) MCG/ACT IN AERS: 2.0000 | INHALATION_SPRAY | RESPIRATORY_TRACT | 1 refills | Status: DC | PRN

## 2018-04-11 NOTE — Patient Instructions (Signed)
Allergic Rhinitis, Adult Allergic rhinitis is an allergic reaction that affects the mucous membrane inside the nose. It causes sneezing, a runny or stuffy nose, and the feeling of mucus going down the back of the throat (postnasal drip). Allergic rhinitis can be mild to severe. There are two types of allergic rhinitis:  Seasonal. This type is also called hay fever. It happens only during certain seasons.  Perennial. This type can happen at any time of the year. What are the causes? This condition happens when the body's defense system (immune system) responds to certain harmless substances called allergens as though they were germs.  Seasonal allergic rhinitis is triggered by pollen, which can come from grasses, trees, and weeds. Perennial allergic rhinitis may be caused by:  House dust mites.  Pet dander.  Mold spores. What are the signs or symptoms? Symptoms of this condition include:  Sneezing.  Runny or stuffy nose (nasal congestion).  Postnasal drip.  Itchy nose.  Tearing of the eyes.  Trouble sleeping.  Daytime sleepiness. How is this diagnosed? This condition may be diagnosed based on:  Your medical history.  A physical exam.  Tests to check for related conditions, such as: ? Asthma. ? Pink eye. ? Ear infection. ? Upper respiratory infection.  Tests to find out which allergens trigger your symptoms. These may include skin or blood tests. How is this treated? There is no cure for this condition, but treatment can help control symptoms. Treatment may include:  Taking medicines that block allergy symptoms, such as antihistamines. Medicine may be given as a shot, nasal spray, or pill.  Avoiding the allergen.  Desensitization. This treatment involves getting ongoing shots until your body becomes less sensitive to the allergen. This treatment may be done if other treatments do not help.  If taking medicine and avoiding the allergen does not work, new, stronger  medicines may be prescribed. Follow these instructions at home:  Find out what you are allergic to. Common allergens include smoke, dust, and pollen.  Avoid the things you are allergic to. These are some things you can do to help avoid allergens: ? Replace carpet with wood, tile, or vinyl flooring. Carpet can trap dander and dust. ? Do not smoke. Do not allow smoking in your home. ? Change your heating and air conditioning filter at least once a month. ? During allergy season:  Keep windows closed as much as possible.  Plan outdoor activities when pollen counts are lowest. This is usually during the evening hours.  When coming indoors, change clothing and shower before sitting on furniture or bedding.  Take over-the-counter and prescription medicines only as told by your health care provider.  Keep all follow-up visits as told by your health care provider. This is important. Contact a health care provider if:  You have a fever.  You develop a persistent cough.  You make whistling sounds when you breathe (you wheeze).  Your symptoms interfere with your normal daily activities. Get help right away if:  You have shortness of breath. Summary  This condition can be managed by taking medicines as directed and avoiding allergens.  Contact your health care provider if you develop a persistent cough or fever.  During allergy season, keep windows closed as much as possible. This information is not intended to replace advice given to you by your health care provider. Make sure you discuss any questions you have with your health care provider. Document Released: 09/28/2000 Document Revised: 02/11/2016 Document Reviewed: 02/11/2016 Elsevier Interactive  Patient Education  2019 [employer].  Albuterol inhalation aerosol What is this medicine? ALBUTEROL (al Normajean [place]) is a bronchodilator. It helps open up the airways in your lungs to make it easier to breathe. This medicine is used  to treat and to prevent bronchospasm. This medicine may be used for other purposes; ask your health care provider or pharmacist if you have questions. COMMON BRAND NAME(S): Proair HFA, Proventil, Proventil HFA, Respirol, Ventolin, Ventolin HFA What should I tell my health care provider before I take this medicine? They need to know if you have any of the following conditions: -diabetes -heart disease or irregular heartbeat -high blood pressure -pheochromocytoma -seizures -thyroid disease -an unusual or allergic reaction to albuterol, levalbuterol, sulfites, other medicines, foods, dyes, or preservatives -pregnant or trying to get pregnant -breast-feeding How should I use this medicine? This medicine is for inhalation through the mouth. Follow the directions on your prescription label. Take your medicine at regular intervals. Do not use more often than directed. Make sure that you are using your inhaler correctly. Ask you doctor or health care provider if you have any questions. Talk to your pediatrician regarding the use of this medicine in children. Special care may be needed. Overdosage: If you think you have taken too much of this medicine contact a poison control center or emergency room at once. NOTE: This medicine is only for you. Do not share this medicine with others. What if I miss a dose? If you miss a dose, use it as soon as you can. If it is almost time for your next dose, use only that dose. Do not use double or extra doses. What may interact with this medicine? -anti-infectives like chloroquine and pentamidine -caffeine -cisapride -diuretics -medicines for colds -medicines for depression or for emotional or psychotic conditions -medicines for weight loss including some herbal products -methadone -some antibiotics like clarithromycin, erythromycin, levofloxacin, and linezolid -some heart medicines -steroid hormones like dexamethasone, cortisone,  hydrocortisone -theophylline -thyroid hormones This list may not describe all possible interactions. Give your health care provider a list of all the medicines, herbs, non-prescription drugs, or dietary supplements you use. Also tell them if you smoke, drink alcohol, or use illegal drugs. Some items may interact with your medicine. What should I watch for while using this medicine? Tell your doctor or health care professional if your symptoms do not improve. Do not use extra albuterol. If your asthma or bronchitis gets worse while you are using this medicine, call your doctor right away. If your mouth gets dry try chewing sugarless gum or sucking hard candy. Drink water as directed. What side effects may I notice from receiving this medicine? Side effects that you should report to your doctor or health care professional as soon as possible: -allergic reactions like skin rash, itching or hives, swelling of the face, lips, or tongue -breathing problems -chest pain -feeling faint or lightheaded, falls -high blood pressure -irregular heartbeat -fever -muscle cramps or weakness -pain, tingling, numbness in the hands or feet -vomiting Side effects that usually do not require medical attention (report to your doctor or health care professional if they continue or are bothersome): -cough -difficulty sleeping -headache -nervousness or trembling -stomach upset -stuffy or runny nose -throat irritation -unusual taste This list may not describe all possible side effects. Call your doctor for medical advice about side effects. You may report side effects to FDA at 1-800-FDA-1088. Where should I keep my medicine? Keep out of the reach of children. Store  at room temperature between 15 and 30 degrees C (59 and 86 degrees F). The contents are under pressure and may burst when exposed to heat or flame. Do not freeze. This medicine does not work as well if it is too cold. Throw away any unused medicine  after the expiration date. Inhalers need to be thrown away after the labeled number of puffs have been used or by the expiration date; whichever comes first. Ventolin HFA should be thrown away 12 months after removing from foil pouch. Check the instructions that come with your medicine. NOTE: This sheet is a summary. It may not cover all possible information. If you have questions about this medicine, talk to your doctor, pharmacist, or health care provider.  2019 Elsevier/Gold Standard (2012-06-21 10:57:17)

## 2018-04-11 NOTE — Progress Notes (Signed)
Virtual Visit via Telephone Note  I connected with Kimberly Cruz on 04/11/18 at  3:00 PM EDT by telephone and verified that I am speaking with the correct person using two identifiers.   I discussed the limitations, risks, security and privacy concerns of performing an evaluation and management service by telephone and the availability of in person appointments. I also discussed with the patient that there may be a patient responsible charge related to this service. The patient expressed understanding and agreed to proceed.   History of Present Illness: This is a 33 yo female who reports a tight, achy feeling in chest off and on for about a week. Worse at night. No fever, no body aches, no sputum. Cough which is worse in the morning, feels like a clearing of throat. At first thought it was allergies, but without runny nose like she typically has with allergy symptoms. No wheezing. No SOB. She has taken Theraflu and allergy medication twice. The first time she had improvement of symptoms, the second time she did not notice as much improvement.  She works in [employer] and one of her employees has had symptoms and was tested for [place] this week and still awaiting results.    Observations/Objective: Patient answers questions appropriately. Able to speak in complete sentences. No cough witnessed.   Assessment and Plan: 1. Cough - unclear etiology, can not rule out allergic rhinitis, viral URI, COVID19 - discussed possible causes with patient, symptomatic treatment and ER precautions - Mychart message sent to patient with information regarding albuterol inhaler, symptomatic treatment, work precautions - albuterol (PROVENTIL HFA;VENTOLIN HFA) 108 (90 Base) MCG/ACT inhaler; Inhale 2 puffs into the lungs every 4 (four) hours as needed for wheezing or shortness of breath (cough, shortness of breath or wheezing.).  Dispense: 1 Inhaler; Refill: 1  2. Chest tightness - albuterol (PROVENTIL  HFA;VENTOLIN HFA) 108 (90 Base) MCG/ACT inhaler; Inhale 2 puffs into the lungs every 4 (four) hours as needed for wheezing or shortness of breath (cough, shortness of breath or wheezing.).  Dispense: 1 Inhaler; Refill: [place], FNP-BC  [facility]  04/11/2018 3:21 PM   Follow Up Instructions:    I discussed the assessment and treatment plan with the patient. The patient was provided an opportunity to ask questions and all were answered. The patient agreed with the plan and demonstrated an understanding of the instructions.   The patient was advised to call back or seek an in-person evaluation if the symptoms worsen or if the condition fails to improve as anticipated.  I provided 13 minutes of non-face-to-face time during this encounter.   Elby Beck, FNP

## 2018-05-02 NOTE — Telephone Encounter (Signed)
Pt seen 03/23/18.

## 2018-05-28 ENCOUNTER — Other Ambulatory Visit: Payer: Self-pay | Admitting: *Deleted

## 2018-05-28 MED ORDER — LEVONORGEST-ETH ESTRAD 91-DAY 0.15-0.03 &0.01 MG PO TABS: 1.0000 | ORAL_TABLET | Freq: Every day | ORAL | 3 refills | Status: DC

## 2018-08-20 ENCOUNTER — Other Ambulatory Visit: Payer: Self-pay

## 2018-08-20 ENCOUNTER — Telehealth: Payer: Self-pay | Admitting: Primary Care

## 2018-08-20 DIAGNOSIS — Z20822 Contact with and (suspected) exposure to covid-19: Secondary | ICD-10-CM

## 2018-08-20 DIAGNOSIS — Z20828 Contact with and (suspected) exposure to other viral communicable diseases: Secondary | ICD-10-CM

## 2018-08-20 NOTE — Telephone Encounter (Signed)
Spoken to patient earlier and she already went [facility] before we called her. She already got the test done.

## 2018-08-20 NOTE — Telephone Encounter (Signed)
Noted, please direct patient to Covid testing centers in [place], [place], [place]. Order placed.

## 2018-08-20 NOTE — Telephone Encounter (Signed)
Best number [phone]  Pt coworkers has a  son who tested positive  They got the results today.  He was in there office today.  Coworker is going to be tested today also.  Pt would like to be tested  No symptoms

## 2018-08-22 LAB — NOVEL CORONAVIRUS, NAA: SARS-CoV-2, NAA: NOT DETECTED

## 2018-12-11 ENCOUNTER — Other Ambulatory Visit: Payer: Self-pay

## 2018-12-11 ENCOUNTER — Ambulatory Visit (INDEPENDENT_AMBULATORY_CARE_PROVIDER_SITE_OTHER): Payer: 59 | Admitting: Primary Care

## 2018-12-11 ENCOUNTER — Encounter: Payer: Self-pay | Admitting: Primary Care

## 2018-12-11 VITALS — BP 110/74 | HR 76 | Temp 96.8°F | Ht 62.75 in | Wt 153.5 lb

## 2018-12-11 DIAGNOSIS — R87810 Cervical high risk human papillomavirus (HPV) DNA test positive: Secondary | ICD-10-CM | POA: Diagnosis not present

## 2018-12-11 DIAGNOSIS — E042 Nontoxic multinodular goiter: Secondary | ICD-10-CM

## 2018-12-11 DIAGNOSIS — Z0000 Encounter for general adult medical examination without abnormal findings: Secondary | ICD-10-CM

## 2018-12-11 NOTE — Assessment & Plan Note (Signed)
Following with GYN, due for repeat pap smear, has an upcoming appointment.

## 2018-12-11 NOTE — Assessment & Plan Note (Signed)
Repeat TSH pending. Ultrasound order placed for thyroid nodule monitoring.

## 2018-12-11 NOTE — Progress Notes (Signed)
Subjective:    Patient ID: Kimberly Cruz, female    DOB: 1985-09-02, 33 y.o.   MRN: [id number]  HPI  [name] is a 33 year old female who presents today for complete physical.  Immunizations: -Tetanus: Completed in 2019 -Influenza: Never completes, declines   Diet: She endorses a healthy diet, has cut out fast food and junk food.  Exercise: She is not exercising.   Eye exam: Due in November 2020 Dental exam: Completes semi-annually   Pap Smear: Completed in 2019  BP Readings from Last 3 Encounters:  12/11/18 110/74  03/23/18 110/72  12/01/17 122/80   Wt Readings from Last 3 Encounters:  12/11/18 153 lb 8 oz (69.6 kg)  03/23/18 168 lb 4 oz (76.3 kg)  12/01/17 167 lb (75.8 kg)     Review of Systems  Constitutional: Negative for unexpected weight change.  HENT: Negative for rhinorrhea.   Respiratory: Negative for cough and shortness of breath.   Cardiovascular: Negative for chest pain.  Gastrointestinal: Negative for constipation and diarrhea.  Genitourinary: Negative for difficulty urinating and menstrual problem.  Musculoskeletal: Negative for arthralgias and myalgias.  Skin: Negative for rash.  Allergic/Immunologic: Negative for environmental allergies.  Neurological: Negative for dizziness, numbness and headaches.  Psychiatric/Behavioral: The patient is not nervous/anxious.        Past Medical History:  Diagnosis Date  . FHx: migraine headaches   . History of stomach ulcers   . Hyperlipidemia   . PONV (postoperative nausea and vomiting)   . Tonsil, abscess      Social History   Socioeconomic History  . Marital status: Single    Spouse name: Not on file  . Number of children: Not on file  . Years of education: Not on file  . Highest education level: Not on file  Occupational History  . Not on file  Social Needs  . Financial resource strain: Not on file  . Food insecurity    Worry: Not on file    Inability: Not on file  . Transportation needs    Medical: Not on file    Non-medical: Not on file  Tobacco Use  . Smoking status: Never Smoker  . Smokeless tobacco: Never Used  Substance and Sexual Activity  . Alcohol use: Yes  . Drug use: No  . Sexual activity: Yes    Birth control/protection: Pill  Lifestyle  . Physical activity    Days per week: Not on file    Minutes per session: Not on file  . Stress: Not on file  Relationships  . Social Herbalist on phone: Not on file    Gets together: Not on file    Attends religious service: Not on file    Active member of club or organization: Not on file    Attends meetings of clubs or organizations: Not on file    Relationship status: Not on file  . Intimate partner violence    Fear of current or ex partner: Not on file    Emotionally abused: Not on file    Physically abused: Not on file    Forced sexual activity: Not on file  Other Topics Concern  . Not on file  Social History Narrative   Single.   No children.   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    Enjoys hiking, camping, travel.    Past Surgical History:  Procedure Laterality Date  . ANKLE SURGERY  2000  . SEPTOPLASTY  06/2014  . TONSILLECTOMY N/A 07/17/2015   Procedure: TONSILLECTOMY;  Surgeon: Beverly Gust, MD;  Location: [facility];  Service: ENT;  Laterality: N/A;    Family History  Problem Relation Age of Onset  . Diverticulitis Father   . Diabetes Maternal Grandmother   . Heart disease Maternal Grandmother   . Hypertension Maternal Grandmother   . Diverticulitis Paternal Grandmother   . Prostate cancer Maternal Grandfather     Allergies  Allergen Reactions  . Zithromax [Azithromycin] Swelling and Anaphylaxis    Current Outpatient Medications on File Prior to Visit  Medication Sig Dispense Refill  . Levonorgestrel-Ethinyl Estradiol (ASHLYNA) 0.15-0.03 &0.01 MG tablet Take 1 tablet by mouth daily. 91 tablet 3   No current facility-administered medications  on file prior to visit.     BP 110/74   Pulse 76   Temp (!) 96.8 F (36 C) (Temporal)   Ht 5' 2.75" (1.594 m)   Wt 153 lb 8 oz (69.6 kg)   SpO2 98%   BMI 27.41 kg/m    Objective:   Physical Exam  Constitutional: She is oriented to person, place, and time. She appears well-nourished.  HENT:  Right Ear: Tympanic membrane and ear canal normal.  Left Ear: Tympanic membrane and ear canal normal.  Mouth/Throat: Oropharynx is clear and moist.  Eyes: Pupils are equal, round, and reactive to light. EOM are normal.  Neck: Neck supple.  Cardiovascular: Normal rate and regular rhythm.  Respiratory: Effort normal and breath sounds normal.  GI: Soft. Bowel sounds are normal. There is no abdominal tenderness.  Musculoskeletal: Normal range of motion.  Neurological: She is alert and oriented to person, place, and time. No cranial nerve deficit.  Reflex Scores:      Patellar reflexes are 2+ on the right side and 2+ on the left side. Skin: Skin is warm and dry.  Psychiatric: She has a normal mood and affect.           Assessment & Plan:

## 2018-12-11 NOTE — Assessment & Plan Note (Signed)
Tetanus UTD, declines influenza vaccine. Pap smear UTD. Commended her on a healthy diet, encouraged regular exercise.  Exam today unremarkable. Labs pending.

## 2018-12-11 NOTE — Patient Instructions (Signed)
Stop by the lab prior to leaving today. I will notify you of your results once received.   You will be contacted regarding your ultrasound.  Please let us know if you have not been contacted within two weeks.   Start exercising. You should be getting 150 minutes of moderate intensity exercise weekly.  Continue to work on a healthy diet. Ensure you are consuming 64 ounces of water daily.  It was a pleasure to see you today!   Preventive Care 4-31 Years Old, Female Preventive care refers to visits with your health care provider and lifestyle choices that can promote health and wellness. This includes:  A yearly physical exam. This may also be called an annual well check.  Regular dental visits and eye exams.  Immunizations.  Screening for certain conditions.  Healthy lifestyle choices, such as eating a healthy diet, getting regular exercise, not using drugs or products that contain nicotine and tobacco, and limiting alcohol use. What can I expect for my preventive care visit? Physical exam Your health care provider will check your:  Height and weight. This may be used to calculate body mass index (BMI), which tells if you are at a healthy weight.  Heart rate and blood pressure.  Skin for abnormal spots. Counseling Your health care provider may ask you questions about your:  Alcohol, tobacco, and drug use.  Emotional well-being.  Home and relationship well-being.  Sexual activity.  Eating habits.  Work and work Statistician.  Method of birth control.  Menstrual cycle.  Pregnancy history. What immunizations do I need?  Influenza (flu) vaccine  This is recommended every year. Tetanus, diphtheria, and pertussis (Tdap) vaccine  You may need a Td booster every 10 years. Varicella (chickenpox) vaccine  You may need this if you have not been vaccinated. Human papillomavirus (HPV) vaccine  If recommended by your health care provider, you may need three doses over  6 months. Measles, mumps, and rubella (MMR) vaccine  You may need at least one dose of MMR. You may also need a second dose. Meningococcal conjugate (MenACWY) vaccine  One dose is recommended if you are age 20-21 years and a first-year college student living in a residence hall, or if you have one of several medical conditions. You may also need additional booster doses. Pneumococcal conjugate (PCV13) vaccine  You may need this if you have certain conditions and were not previously vaccinated. Pneumococcal polysaccharide (PPSV23) vaccine  You may need one or two doses if you smoke cigarettes or if you have certain conditions. Hepatitis A vaccine  You may need this if you have certain conditions or if you travel or work in places where you may be exposed to hepatitis A. Hepatitis B vaccine  You may need this if you have certain conditions or if you travel or work in places where you may be exposed to hepatitis B. Haemophilus influenzae type b (Hib) vaccine  You may need this if you have certain conditions. You may receive vaccines as individual doses or as more than one vaccine together in one shot (combination vaccines). Talk with your health care provider about the risks and benefits of combination vaccines. What tests do I need?  Blood tests  Lipid and cholesterol levels. These may be checked every 5 years starting at age 36.  Hepatitis C test.  Hepatitis B test. Screening  Diabetes screening. This is done by checking your blood sugar (glucose) after you have not eaten for a while (fasting).  Sexually transmitted disease (  STD) testing.  BRCA-related cancer screening. This may be done if you have a family history of breast, ovarian, tubal, or peritoneal cancers.  Pelvic exam and Pap test. This may be done every 3 years starting at age 68. Starting at age 43, this may be done every 5 years if you have a Pap test in combination with an HPV test. Talk with your health care  provider about your test results, treatment options, and if necessary, the need for more tests. Follow these instructions at home: Eating and drinking   Eat a diet that includes fresh fruits and vegetables, whole grains, lean protein, and low-fat dairy.  Take vitamin and mineral supplements as recommended by your health care provider.  Do not drink alcohol if: ? Your health care provider tells you not to drink. ? You are pregnant, may be pregnant, or are planning to become pregnant.  If you drink alcohol: ? Limit how much you have to 0-1 drink a day. ? Be aware of how much alcohol is in your drink. In the [place] one drink equals one 12 oz bottle of beer (355 mL), one 5 oz glass of wine (148 mL), or one 1 oz glass of hard liquor (44 mL). Lifestyle  Take daily care of your teeth and gums.  Stay active. Exercise for at least 30 minutes on 5 or more days each week.  Do not use any products that contain nicotine or tobacco, such as cigarettes, e-cigarettes, and chewing tobacco. If you need help quitting, ask your health care provider.  If you are sexually active, practice safe sex. Use a condom or other form of birth control (contraception) in order to prevent pregnancy and STIs (sexually transmitted infections). If you plan to become pregnant, see your health care provider for a preconception visit. What's next?  Visit your health care provider once a year for a well check visit.  Ask your health care provider how often you should have your eyes and teeth checked.  Stay up to date on all vaccines. This information is not intended to replace advice given to you by your health care provider. Make sure you discuss any questions you have with your health care provider. Document Released: 03/01/2001 Document Revised: 09/14/2017 Document Reviewed: 09/14/2017 Elsevier Patient Education  2020 [employer].

## 2018-12-12 LAB — LIPID PANEL
Cholesterol: 189 mg/dL (ref 0–200)
HDL: 38.4 mg/dL — ABNORMAL LOW (ref >39.00)
LDL Cholesterol: 120 mg/dL — ABNORMAL HIGH (ref 0–99)
NonHDL: 150.96
Total CHOL/HDL Ratio: 5
Triglycerides: 157 mg/dL — ABNORMAL HIGH (ref 0.0–149.0)
VLDL: 31.4 mg/dL (ref 0.0–40.0)

## 2018-12-12 LAB — TSH: TSH: 1.41 u[IU]/mL (ref 0.35–4.50)

## 2018-12-12 LAB — COMPREHENSIVE METABOLIC PANEL
ALT: 22 U/L (ref 0–35)
AST: 17 U/L (ref 0–37)
Albumin: 4 g/dL (ref 3.5–5.2)
Alkaline Phosphatase: 76 U/L (ref 39–117)
BUN: 9 mg/dL (ref 6–23)
CO2: 26 mEq/L (ref 19–32)
Calcium: 9.2 mg/dL (ref 8.4–10.5)
Chloride: 102 mEq/L (ref 96–112)
Creatinine, Ser: 0.6 mg/dL (ref 0.40–1.20)
GFR: 116.24 mL/min (ref >60.00)
Glucose, Bld: 76 mg/dL (ref 70–99)
Potassium: 4.4 mEq/L (ref 3.5–5.1)
Sodium: 137 mEq/L (ref 135–145)
Total Bilirubin: 0.4 mg/dL (ref 0.2–1.2)
Total Protein: 6.9 g/dL (ref 6.0–8.3)

## 2018-12-19 LAB — HM PAP SMEAR

## 2018-12-28 ENCOUNTER — Other Ambulatory Visit: Payer: 59

## 2019-01-04 ENCOUNTER — Other Ambulatory Visit: Payer: 59

## 2019-01-04 NOTE — Telephone Encounter (Signed)
Faxed as requested

## 2019-01-04 NOTE — Telephone Encounter (Signed)
Lorette Ang for fax number. I'll put this in your inbox.

## 2019-01-06 LAB — RESULTS CONSOLE HPV: CHL HPV: NEGATIVE

## 2019-03-21 IMAGING — US US ABDOMEN LIMITED
1 series · 14 of 25 positions shown · non-contrast
Comparison: None.

CLINICAL DATA: Right upper quadrant pain

EXAM:
ULTRASOUND ABDOMEN LIMITED RIGHT UPPER QUADRANT

[Series 1: us abdomen limited · 0.23mm/px · 14 of 70 slices shown]
[im 1/70]
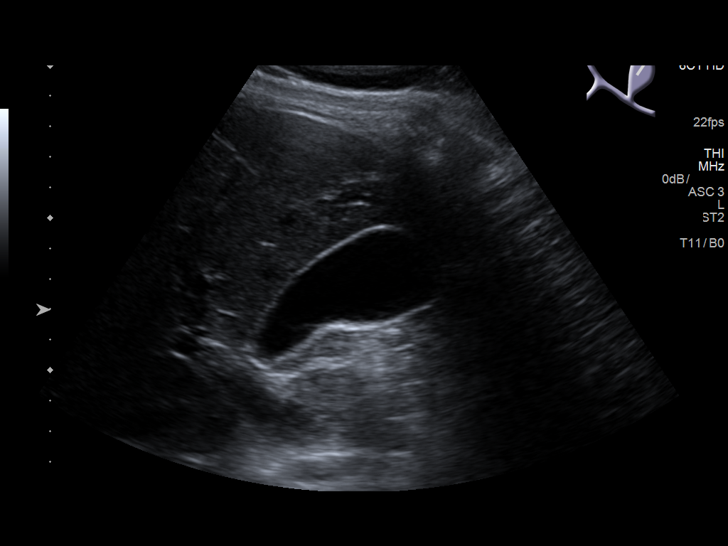
[im 6/70]
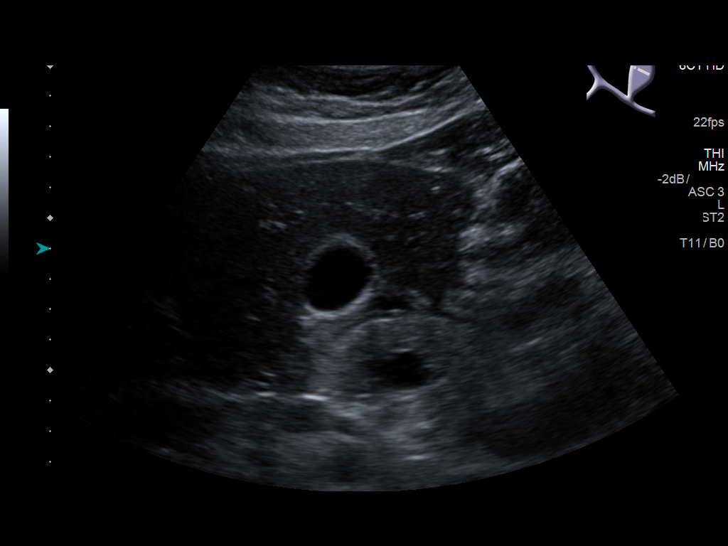
[im 12/70]
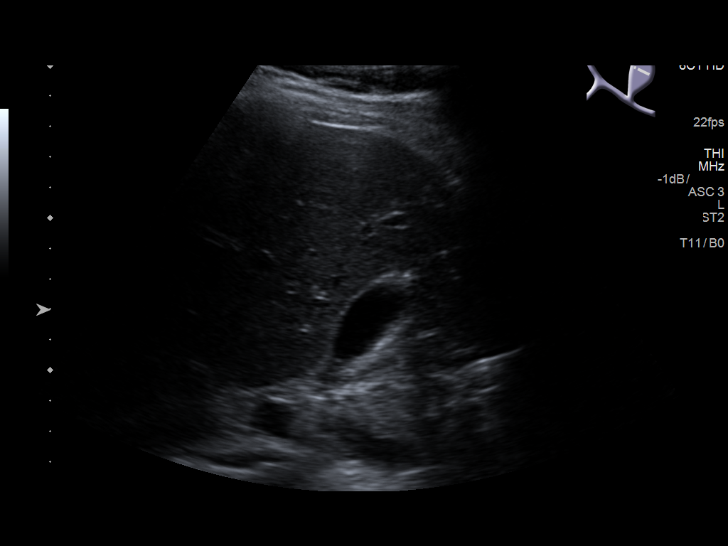
[im 18/70]
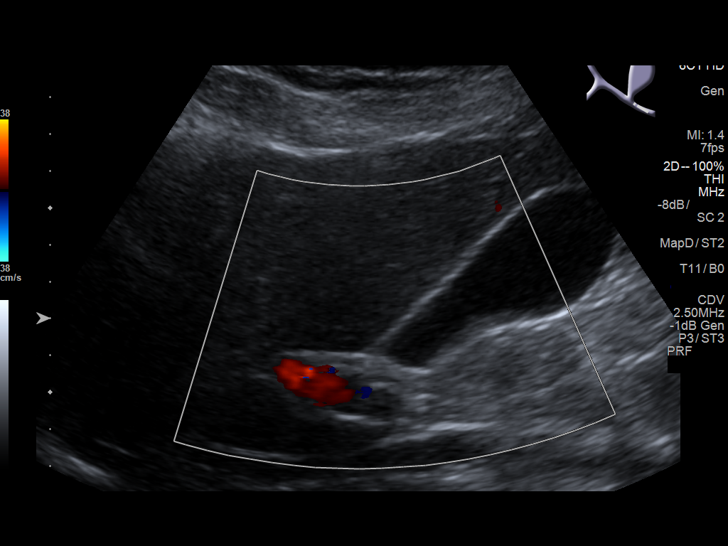
[im 24/70]
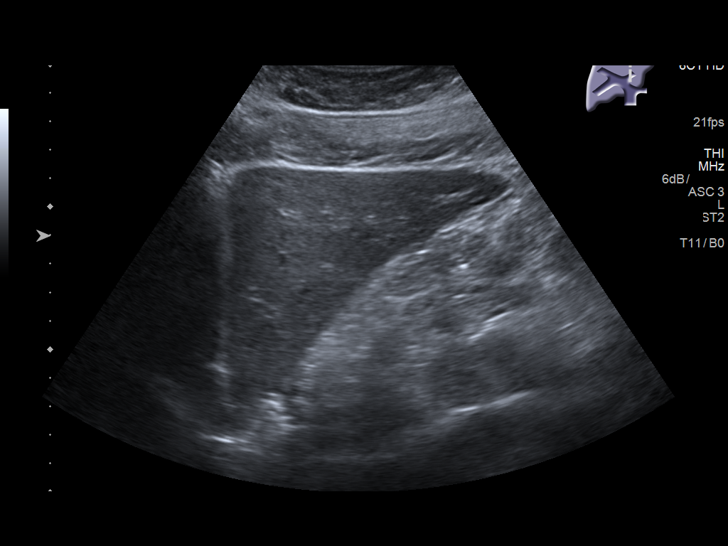
[im 26/70]
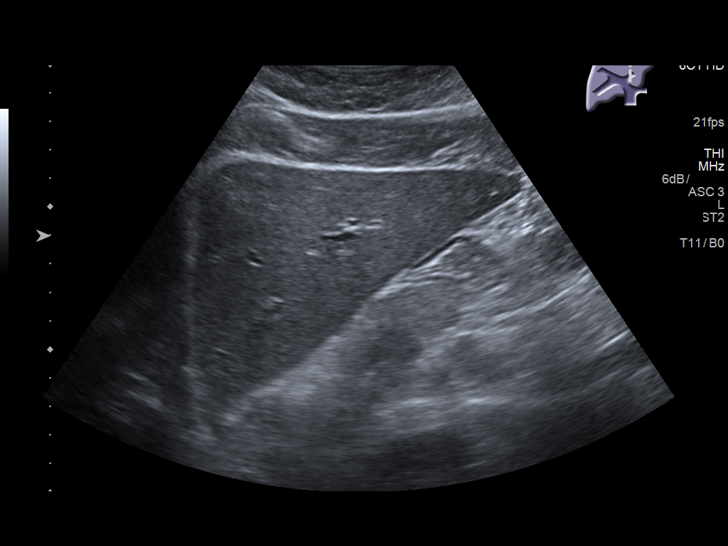
[im 32/70]
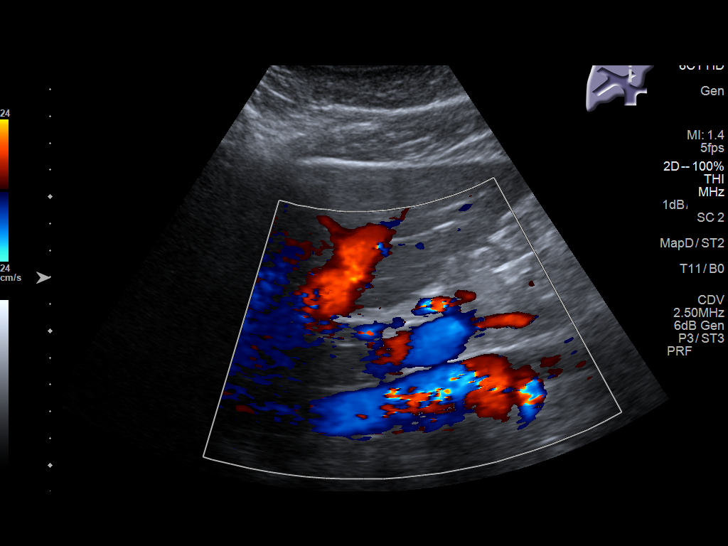
[im 38/70]
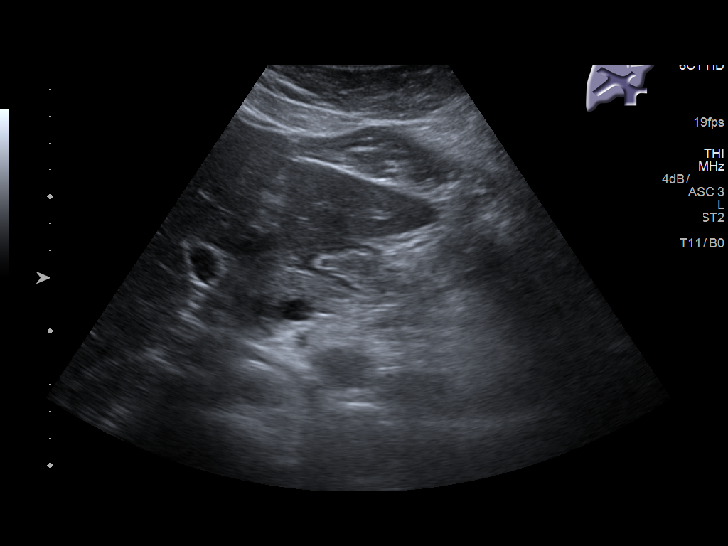
[im 44/70]
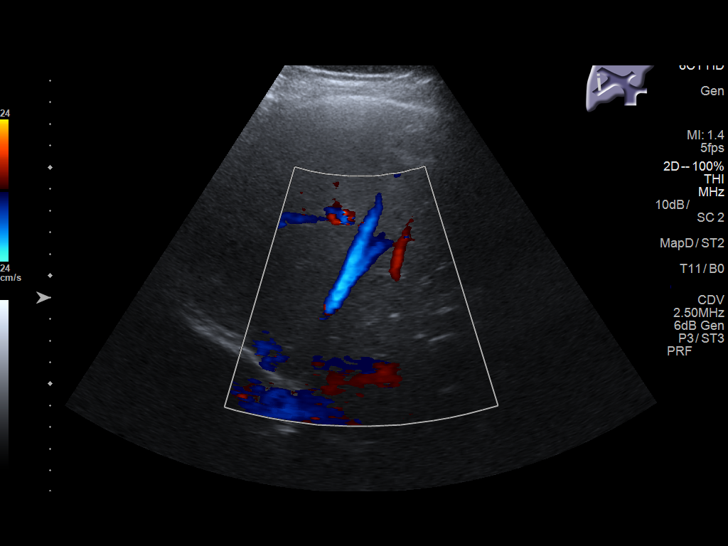
[im 47/70]
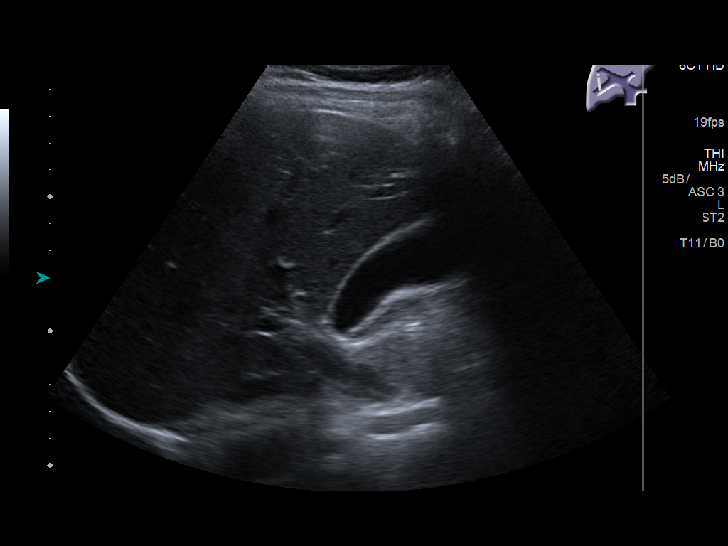
[im 52/70]
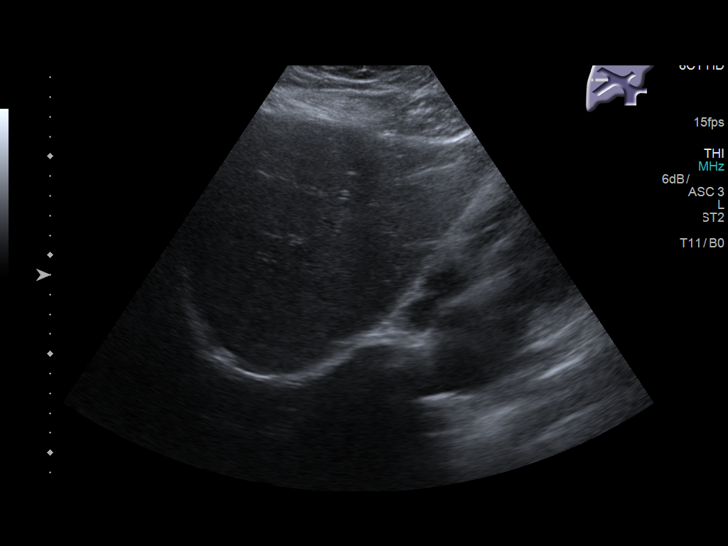
[im 58/70]
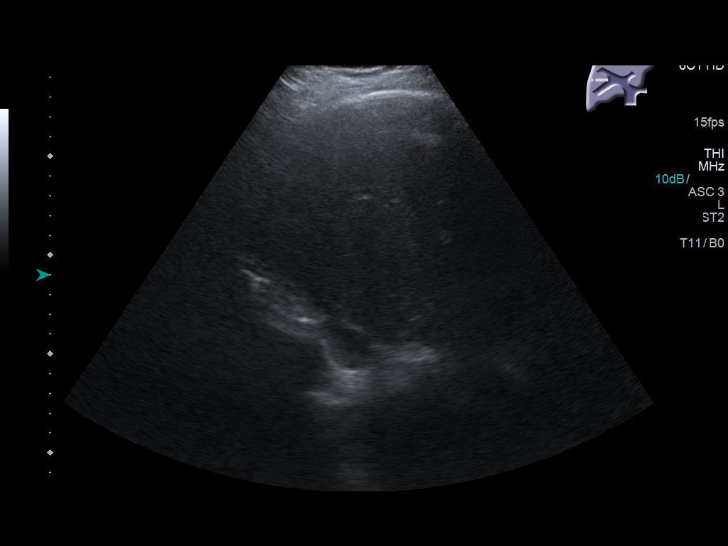
[im 64/70]
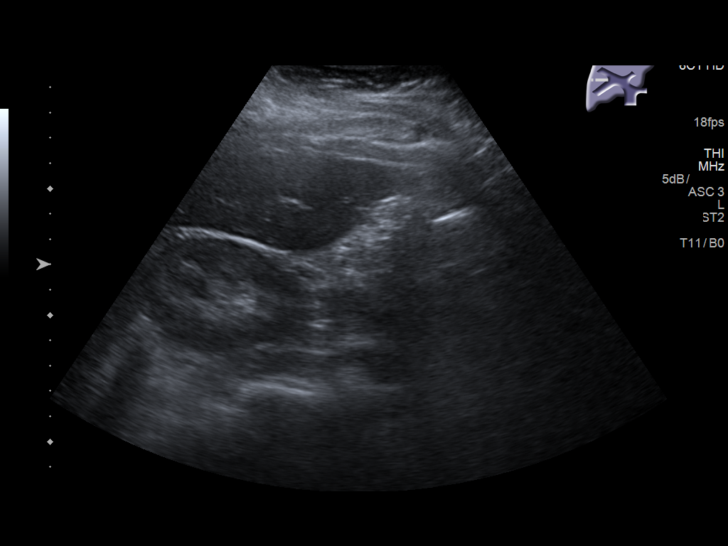
[im 70/70]
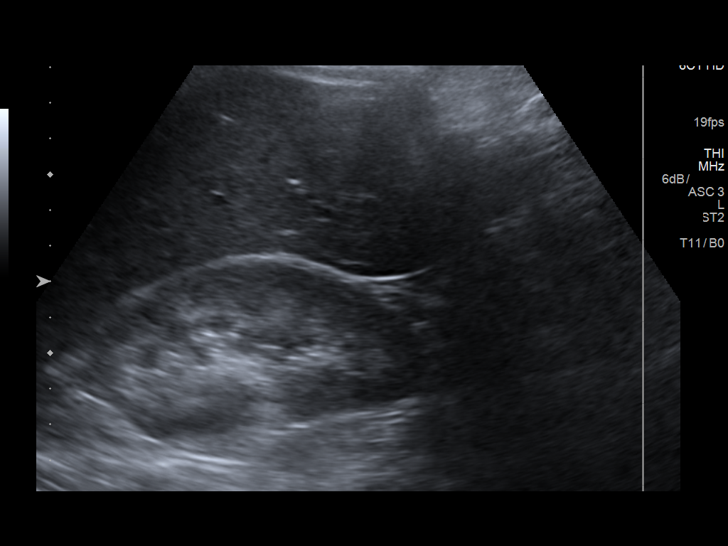

[14 of 25 positions shown; findings below may reference images not displayed]

FINDINGS: Gallbladder:

No gallstones or wall thickening visualized. No sonographic Murphy
sign noted by sonographer.

Common bile duct:

Diameter: Normal caliber, 2 mm

Liver:

No focal lesion identified. Within normal limits in parenchymal
echogenicity. Portal vein is patent on color Doppler imaging with
normal direction of blood flow towards the liver.

Incidentally noted is increased echotexture throughout the right
kidney which could signify chronic medical renal disease.
IMPRESSION: No acute findings in the right upper quadrant.

Apparent increased echotexture within the right kidney, question
chronic medical renal disease. Recommend clinical correlation.

## 2019-05-02 ENCOUNTER — Other Ambulatory Visit: Payer: Self-pay | Admitting: Obstetrics & Gynecology

## 2019-10-09 ENCOUNTER — Telehealth: Payer: Self-pay | Admitting: Primary Care

## 2019-10-09 NOTE — Telephone Encounter (Signed)
Pt got rapid covid test yesterday @ [facility] [address]   The results were neg.  She is wanting to know if she should get another test.  She is having Cough Sore throat Chest achy from cough so much Tired   She wears her mask all the time

## 2019-10-10 NOTE — Telephone Encounter (Signed)
Please advise 

## 2019-10-10 NOTE — Telephone Encounter (Signed)
I recommend she go to a [facility] facility for the send off PCR testing. She can find testing sites on [facility] website.

## 2019-10-10 NOTE — Telephone Encounter (Signed)
Left message to return call to our office.  

## 2019-10-10 NOTE — Telephone Encounter (Signed)
Called patient gave information she will call if any issues.

## 2019-12-20 ENCOUNTER — Encounter: Payer: 59 | Admitting: Primary Care

## 2020-01-19 IMAGING — US US THYROID
1 series · 13 of 25 positions shown · non-contrast
Comparison: None.

CLINICAL DATA: Multiple thyroid nodules. History of benign biopsy
in 9227.

EXAM:
THYROID ULTRASOUND
TECHNIQUE: Ultrasound examination of the thyroid gland and adjacent soft
tissues was performed.

[Series 1: us thyroid · 0.04mm/px · 13 of 70 slices shown]
[im 1/70]
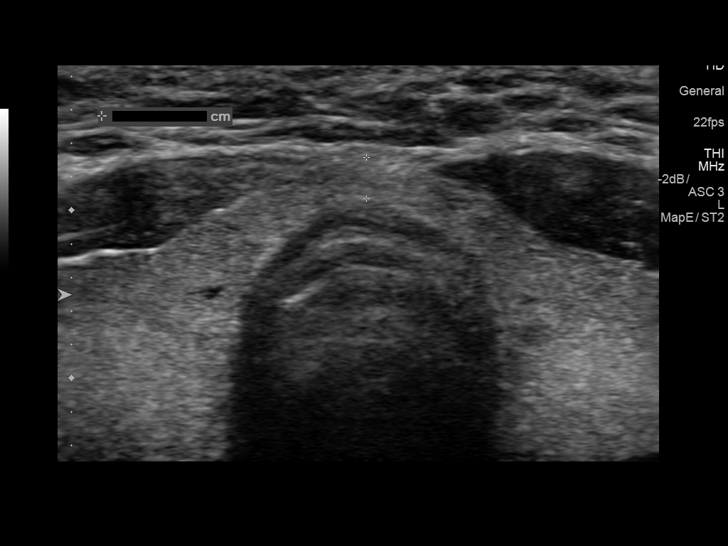
[im 6/70]
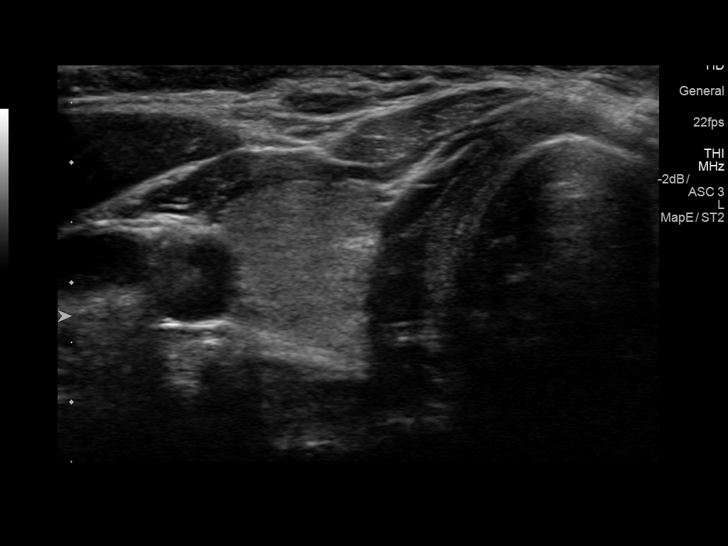
[im 12/70]
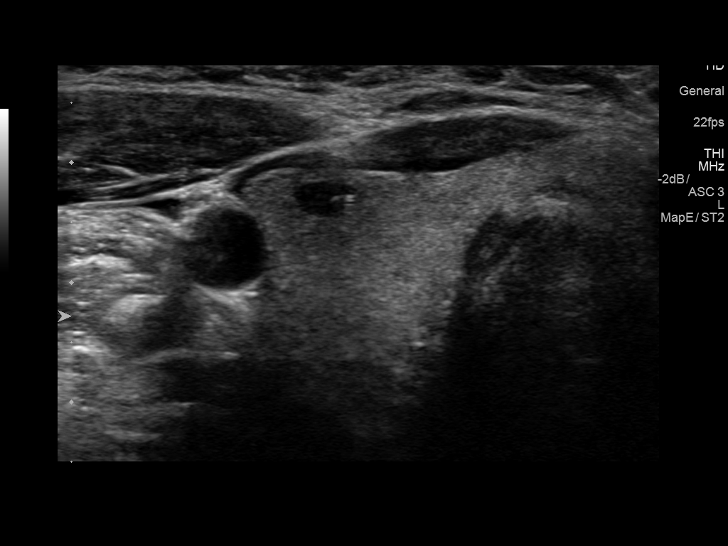
[im 18/70]
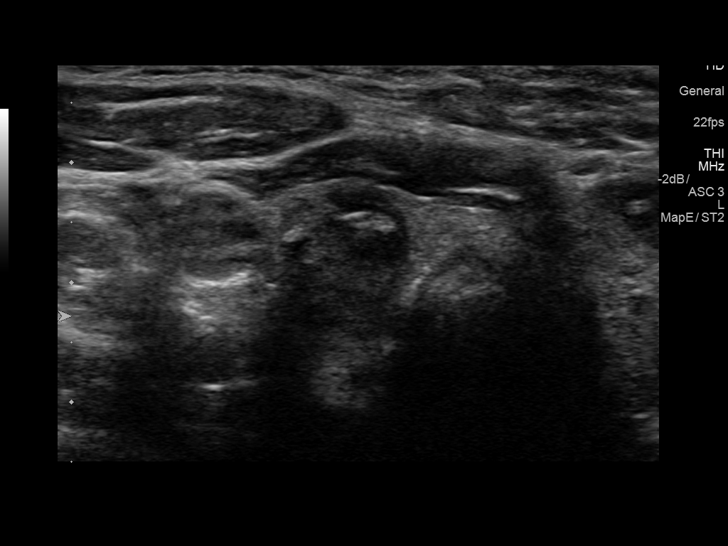
[im 24/70]
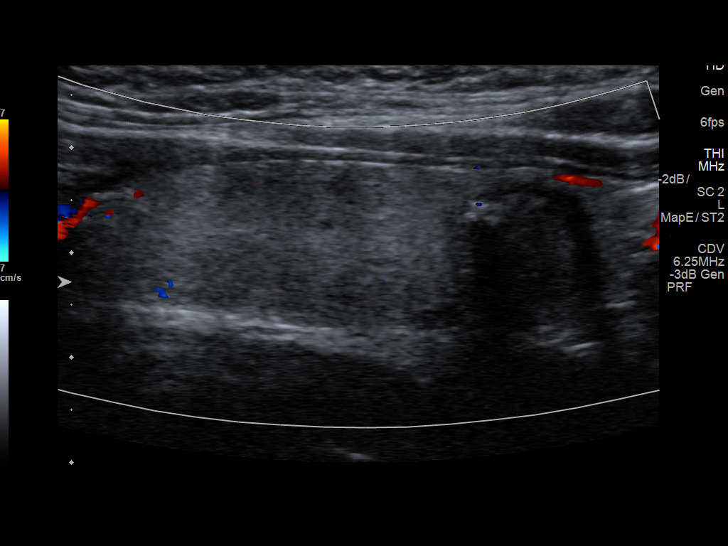
[im 29/70]
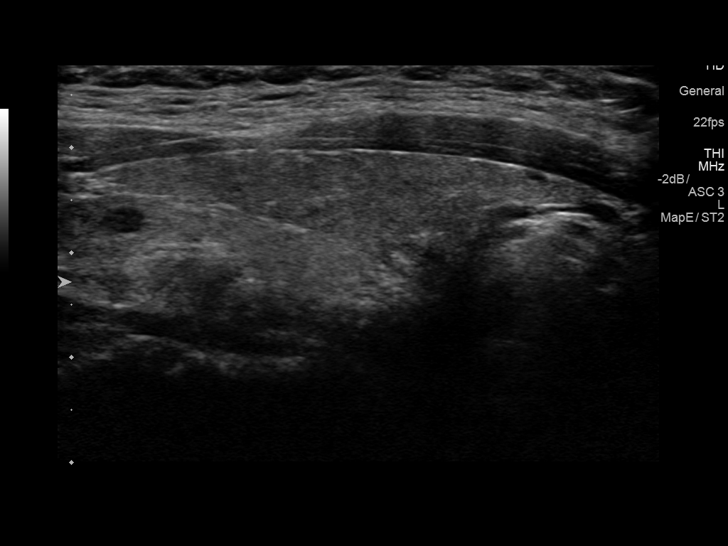
[im 35/70]
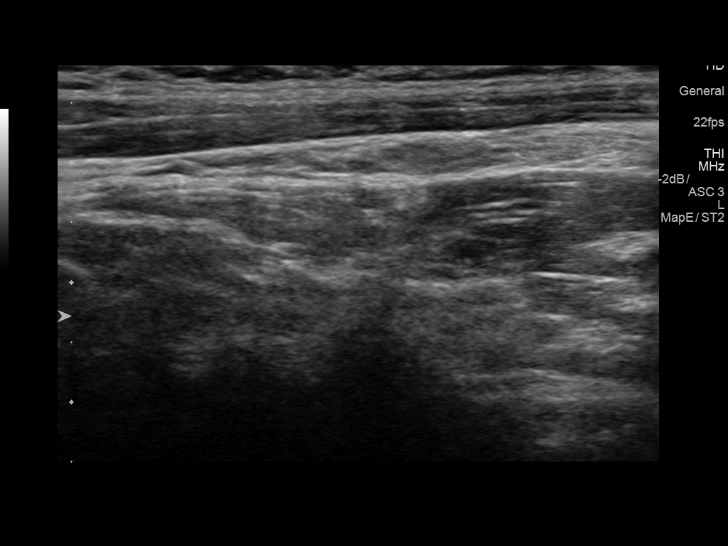
[im 41/70]
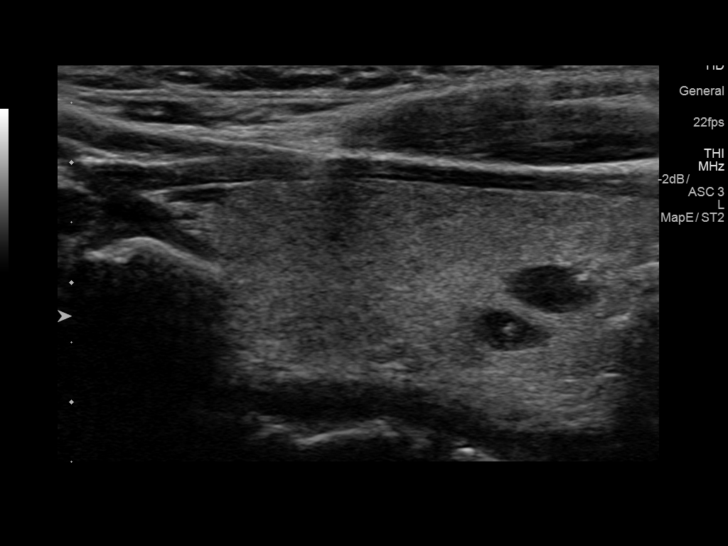
[im 47/70]
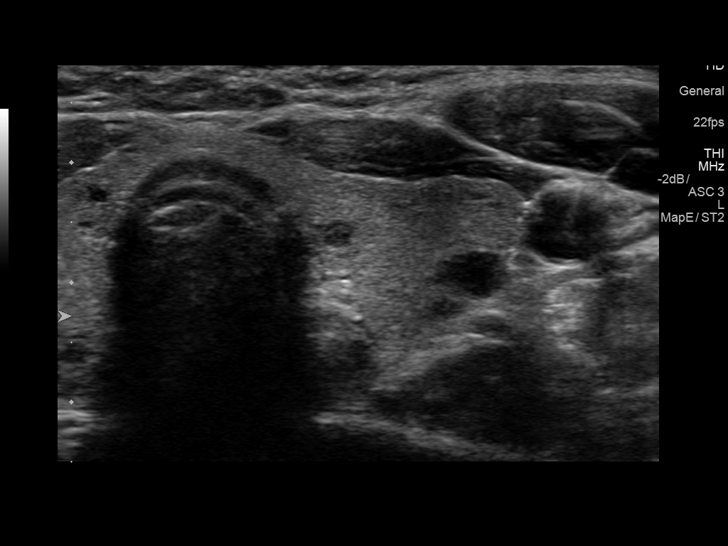
[im 52/70]
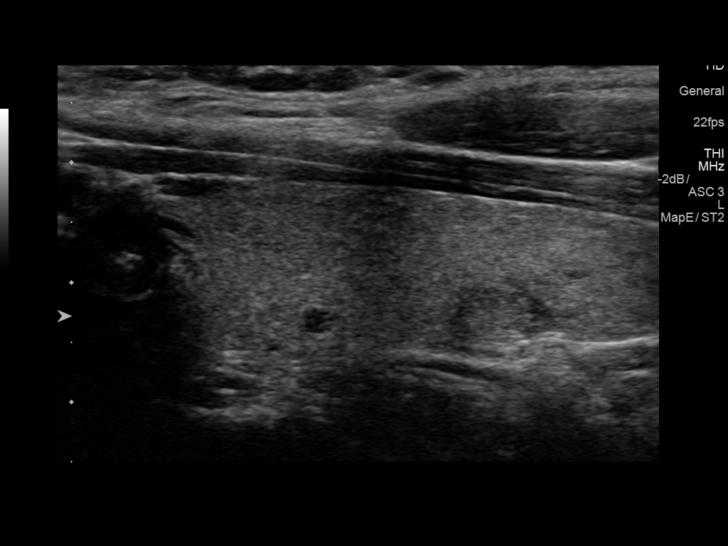
[im 58/70]
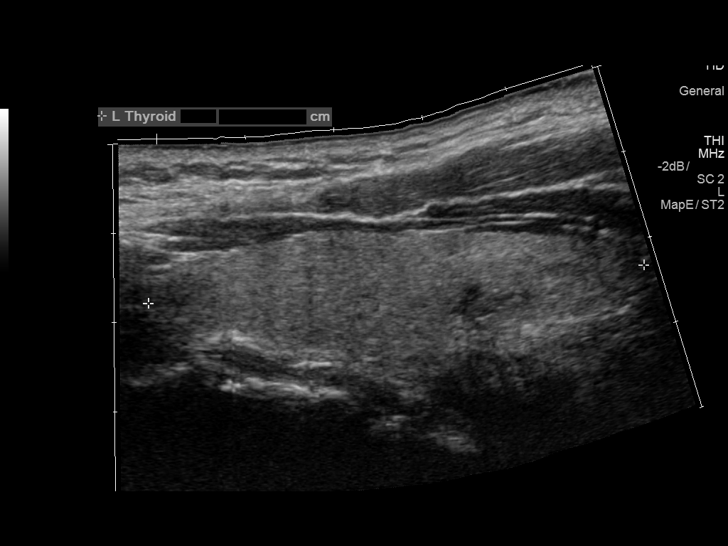
[im 64/70]
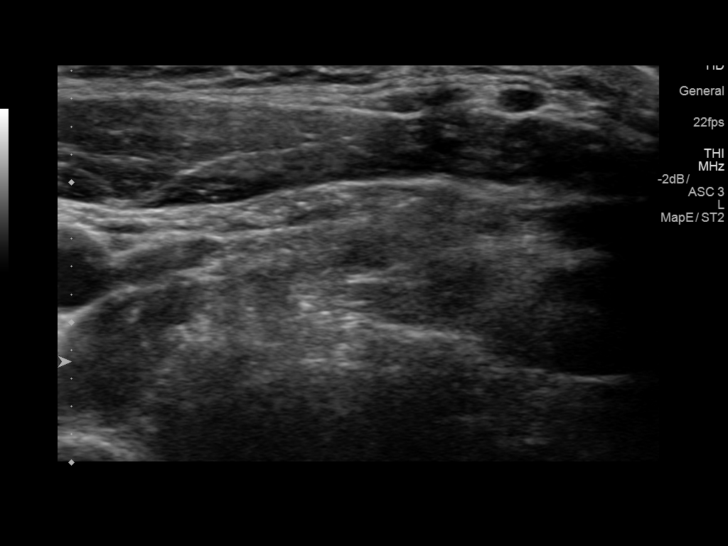
[im 70/70]
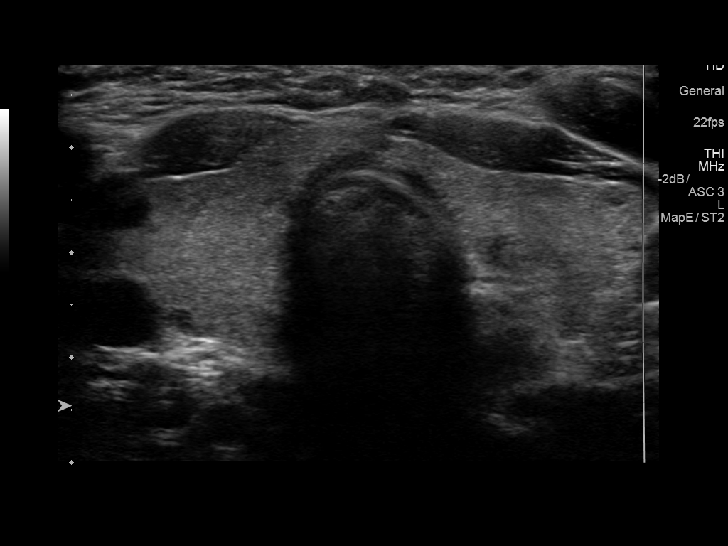

[13 of 25 positions shown; findings below may reference images not displayed]

FINDINGS: Parenchymal Echotexture: Mildly heterogenous

Isthmus: 0.3 cm

Right lobe: 5.4 x 1.6 x 1.9 cm

Left lobe: 5.6 x 1.6 x 1.9 cm

_________________________________________________________

Estimated total number of nodules >/= 1 cm: 1

Number of spongiform nodules >/=  2 cm not described below (TR1): 0

Number of mixed cystic and solid nodules >/= 1.5 cm not described
below (TR2): 0

_________________________________________________________

Nodule # 2:

Location: Right; inferior

Maximum size: 1.4 cm; Other 2 dimensions: 1.2 x 1.2 cm

Composition: solid/almost completely solid (2)

Echogenicity: hypoechoic (2)

Shape: not taller-than-wide (0)

Margins: ill-defined (0)

Echogenic foci: macrocalcifications (1)

ACR TI-RADS total points: 5.

ACR TI-RADS risk category: TR4 (4-6 points).

ACR TI-RADS recommendations:

*Given size (>/= 1 - 1.4 cm) and appearance, a follow-up ultrasound
in 1 year should be considered based on TI-RADS criteria.

_________________________________________________________

Mildly complex cyst in the mid right thyroid lobe measures up to
cm and does not meet criteria for biopsy or dedicated follow-up.

Small hypoechoic lymph nodes on the right side of the neck.

Nodule # 5:

Location: Left; Inferior

Maximum size: 0.9 cm; Other 2 dimensions: 0.6 x 0.8 cm

Composition: solid/almost completely solid (2)

Echogenicity: isoechoic (1)

Shape: not taller-than-wide (0)

Margins: ill-defined (0)

Echogenic foci: none (0)

ACR TI-RADS total points: 3.

ACR TI-RADS risk category: TR3 (3 points).

ACR TI-RADS recommendations:

Given size (<1.4 cm) and appearance, this nodule does NOT meet
TI-RADS criteria for biopsy or dedicated follow-up.

_________________________________________________________

Several subcentimeter nodules in left thyroid lobe that do not meet
criteria for biopsy or dedicated follow-up. There may be a few small
hypoechoic lymph nodes on left side of the neck. Overall, no
significant lymph node enlargement in the neck.
IMPRESSION: 1. Bilateral thyroid nodules.
2. Nodule #2 in the inferior right thyroid lobe meets criteria for 1
year follow-up. Not known if this represents the previously biopsied
nodule. Other thyroid nodules do not meet criteria for biopsy or
dedicated follow-up.

The above is in keeping with the ACR TI-RADS recommendations - [HOSPITAL] 4783;[DATE].

## 2020-01-24 ENCOUNTER — Encounter: Payer: 59 | Admitting: Primary Care

## 2020-02-14 ENCOUNTER — Other Ambulatory Visit: Payer: Self-pay

## 2020-02-14 ENCOUNTER — Encounter: Payer: Self-pay | Admitting: Family Medicine

## 2020-02-14 ENCOUNTER — Ambulatory Visit: Payer: Managed Care, Other (non HMO) | Admitting: Family Medicine

## 2020-02-14 VITALS — BP 128/78 | HR 73 | Temp 98.0°F | Ht 63.0 in | Wt 169.8 lb

## 2020-02-14 DIAGNOSIS — R195 Other fecal abnormalities: Secondary | ICD-10-CM | POA: Diagnosis not present

## 2020-02-14 DIAGNOSIS — R8290 Unspecified abnormal findings in urine: Secondary | ICD-10-CM | POA: Diagnosis not present

## 2020-02-14 LAB — CBC WITH DIFFERENTIAL/PLATELET
Eosinophils Relative: 2.7 %
Lymphs Abs: 2585 cells/uL (ref 850–3900)
MCV: 89.2 fL (ref 80.0–100.0)
Neutro Abs: 5608 cells/uL (ref 1500–7800)

## 2020-02-14 LAB — POC URINALSYSI DIPSTICK (AUTOMATED)
Bilirubin, UA: NEGATIVE
Glucose, UA: NEGATIVE
Ketones, UA: NEGATIVE
Leukocytes, UA: NEGATIVE
Nitrite, UA: NEGATIVE
Protein, UA: NEGATIVE
Spec Grav, UA: 1.015 (ref 1.010–1.025)
Urobilinogen, UA: 0.2 E.U./dL
pH, UA: 6 (ref 5.0–8.0)

## 2020-02-14 LAB — TSH: TSH: 0.99 mIU/L

## 2020-02-14 NOTE — Progress Notes (Signed)
Patient ID: Kimberly Cruz, female    DOB: [birth year], 35 y.o.   MRN: [id number]  This visit was conducted in person.  BP 128/78   Pulse 73   Temp 98 F (36.7 C) (Temporal)   Ht 5\' 3"  (1.6 m)   Wt 169 lb 12 oz (77 kg)   LMP 02/02/2020   SpO2 99%   BMI 30.07 kg/m    CC:  Chief Complaint  Patient presents with  . abnormal odor to urine/stool/sweat    Started 2 weeks after Thanksgiving    Subjective:   HPI: Kimberly Cruz is a 35 y.o. female presenting on 02/14/2020 for abnormal odor to urine/stool/sweat (Started 2 weeks after Thanksgiving)   35 year old female patient pf Kimberly Cruz with history of gastritis and tyroid nodules presents with new onset odor in urine, stool and sweat.  2-3 months  She started noting sulfur like smell in stool. Softer stool.   She has tried diet change , increase water ( 5 bottles water a day now). After 1 month she started noting urine sulfur odor. LAst week she noted sweat started to smell as well.  No other symptoms other than chronic insomnia.  Stools now normal.. one to 2 BMs a day.  No dysuria.   No new meds. She has started taking zinc, vit D.. but this as after noted stool change. Has stopped all meds 2 weeks ago.y   She may have had COVID in 10/2019, not sure. everything else smells the same. No chronic sinusitis.  No change taste.  Relevant past medical, surgical, family and social history reviewed and updated as indicated. Interim medical history since our last visit reviewed. Allergies and medications reviewed and updated. Outpatient Medications Prior to Visit  Medication Sig Dispense Refill  . Levonorgestrel-Ethinyl Estradiol (ASHLYNA) 0.15-0.03 &0.01 MG tablet Take 1 tablet by mouth daily. 91 tablet 3   No facility-administered medications prior to visit.     Per HPI unless specifically indicated in ROS section below Review of Systems  Constitutional: Negative for fatigue and fever.  HENT: Negative for congestion.    Eyes: Negative for pain.  Respiratory: Negative for cough and shortness of breath.   Cardiovascular: Negative for chest pain, palpitations and leg swelling.  Gastrointestinal: Negative for abdominal pain.  Genitourinary: Negative for dysuria and vaginal bleeding.  Musculoskeletal: Negative for back pain.  Neurological: Negative for syncope, light-headedness and headaches.  Psychiatric/Behavioral: Negative for dysphoric mood.   Objective:  BP 128/78   Pulse 73   Temp 98 F (36.7 C) (Temporal)   Ht 5\' 3"  (1.6 m)   Wt 169 lb 12 oz (77 kg)   LMP 02/02/2020   SpO2 99%   BMI 30.07 kg/m   Wt Readings from Last 3 Encounters:  02/14/20 169 lb 12 oz (77 kg)  12/11/18 153 lb 8 oz (69.6 kg)  03/23/18 168 lb 4 oz (76.3 kg)      Physical Exam Constitutional:      General: She is not in acute distress.Vital signs are normal.     Appearance: Normal appearance. She is well-developed and well-nourished. She is not ill-appearing or toxic-appearing.  HENT:     Head: Normocephalic.     Right Ear: Hearing, tympanic membrane, ear canal and external ear normal. Tympanic membrane is not erythematous, retracted or bulging.     Left Ear: Hearing, tympanic membrane, ear canal and external ear normal. Tympanic membrane is not erythematous, retracted or bulging.     Nose:  No mucosal edema or rhinorrhea.     Right Sinus: No maxillary sinus tenderness or frontal sinus tenderness.     Left Sinus: No maxillary sinus tenderness or frontal sinus tenderness.     Mouth/Throat:     Mouth: Oropharynx is clear and moist and mucous membranes are normal.     Pharynx: Uvula midline.  Eyes:     General: Lids are normal. Lids are everted, no foreign bodies appreciated.     Extraocular Movements: EOM normal.     Conjunctiva/sclera: Conjunctivae normal.     Pupils: Pupils are equal, round, and reactive to light.  Neck:     Thyroid: No thyroid mass or thyromegaly.     Vascular: No carotid bruit.     Trachea:  Trachea normal.  Cardiovascular:     Rate and Rhythm: Normal rate and regular rhythm.     Pulses: Normal pulses and intact distal pulses.     Heart sounds: Normal heart sounds, S1 normal and S2 normal. No murmur heard. No friction rub. No gallop.   Pulmonary:     Effort: Pulmonary effort is normal. No tachypnea or respiratory distress.     Breath sounds: Normal breath sounds. No decreased breath sounds, wheezing, rhonchi or rales.  Abdominal:     General: Bowel sounds are normal.     Palpations: Abdomen is soft.     Tenderness: There is no abdominal tenderness.  Musculoskeletal:     Cervical back: Normal range of motion and neck supple.  Skin:    General: Skin is warm, dry and intact.     Findings: No rash.  Neurological:     Mental Status: She is alert.  Psychiatric:        Mood and Affect: Mood is not anxious or depressed.        Speech: Speech normal.        Behavior: Behavior normal. Behavior is cooperative.        Thought Content: Thought content normal.        Cognition and Memory: Cognition and memory normal.        Judgment: Judgment normal.       Results for orders placed or performed in visit on 02/14/20  POCT Urinalysis Dipstick (Automated)  Result Value Ref Range   Color, UA Yellow    Clarity, UA Clear    Glucose, UA Negative Negative   Bilirubin, UA Negative    Ketones, UA Negative    Spec Grav, UA 1.015 1.010 - 1.025   Blood, UA Small (1+)    pH, UA 6.0 5.0 - 8.0   Protein, UA Negative Negative   Urobilinogen, UA 0.2 0.2 or 1.0 E.U./dL   Nitrite, UA Negative    Leukocytes, UA Negative Negative    This visit occurred during the SARS-CoV-2 public health emergency.  Safety protocols were in place, including screening questions prior to the visit, additional usage of staff PPE, and extensive cleaning of exam room while observing appropriate contact time as indicated for disinfecting solutions.   COVID 19 screen:  No recent travel or known exposure to  COVID19 The patient denies respiratory symptoms of COVID 19 at this time. The importance of social distancing was discussed today.   Assessment and Plan  Urine, stool and sweat odor: Normal UA.  No associated issues. No red flags.  Keep up with fluids.  Eval with labs.  May be related to smell changes from recent COVID infection.   Orders Placed This Encounter  Procedures  .  TSH  . Comprehensive metabolic panel  . CBC with Differential/Platelet  . POCT Urinalysis Dipstick (Automated)     Eliezer Lofts, MD

## 2020-02-15 LAB — COMPREHENSIVE METABOLIC PANEL
AG Ratio: 1.8 (calc) (ref 1.0–2.5)
ALT: 64 U/L — ABNORMAL HIGH (ref 6–29)
AST: 33 U/L — ABNORMAL HIGH (ref 10–30)
Albumin: 4.4 g/dL (ref 3.6–5.1)
Alkaline phosphatase (APISO): 65 U/L (ref 31–125)
BUN: 10 mg/dL (ref 7–25)
CO2: 25 mmol/L (ref 20–32)
Calcium: 9.5 mg/dL (ref 8.6–10.2)
Chloride: 100 mmol/L (ref 98–110)
Creat: 0.63 mg/dL (ref 0.50–1.10)
Globulin: 2.5 g/dL (calc) (ref 1.9–3.7)
Glucose, Bld: 76 mg/dL (ref 65–99)
Potassium: 4.2 mmol/L (ref 3.5–5.3)
Sodium: 136 mmol/L (ref 135–146)
Total Bilirubin: 0.6 mg/dL (ref 0.2–1.2)
Total Protein: 6.9 g/dL (ref 6.1–8.1)

## 2020-02-15 LAB — CBC WITH DIFFERENTIAL/PLATELET
Absolute Monocytes: 818 cells/uL (ref 200–950)
Basophils Absolute: 37 cells/uL (ref 0–200)
Basophils Relative: 0.4 %
Eosinophils Absolute: 251 cells/uL (ref 15–500)
HCT: 43 % (ref 35.0–45.0)
Hemoglobin: 14.7 g/dL (ref 11.7–15.5)
MCH: 30.5 pg (ref 27.0–33.0)
MCHC: 34.2 g/dL (ref 32.0–36.0)
MPV: 10.8 fL (ref 7.5–12.5)
Monocytes Relative: 8.8 %
Neutrophils Relative %: 60.3 %
Platelets: 330 10*3/uL (ref 140–400)
RBC: 4.82 10*6/uL (ref 3.80–5.10)
RDW: 11.6 % (ref 11.0–15.0)
Total Lymphocyte: 27.8 %
WBC: 9.3 10*3/uL (ref 3.8–10.8)

## 2020-02-17 ENCOUNTER — Telehealth: Payer: Self-pay | Admitting: *Deleted

## 2020-02-17 NOTE — Telephone Encounter (Signed)
See Phone Note from today 02/17/2020.

## 2020-02-17 NOTE — Telephone Encounter (Signed)
Left message for Kimberly Cruz to return my call.  Needs to know the following:  Labs look normal except liver function tests are slightly elevated. Is Patient:   1. Taking any new meds?  2. Taking any supplements?  3.  Taking any Tylenol?  4.  How much alcohol do you drink?

## 2020-02-17 NOTE — Telephone Encounter (Signed)
Patient called back stating that she couldn't respond to your questions in my chart about her lab results so she is calling back. Please give her a call back to discuss. EM

## 2020-02-17 NOTE — Telephone Encounter (Signed)
AT NEW YEARS SHE WAS TAKING ZINC, VITAMIN D, MELATONIN. STARTED WOMENS PROBIOTIC LAST WEEK. NO OTHER SUPPLEMENTS. TYLENOL ONCE OR TWICE A WEEK FOR HEADACHES 200 OR 250 MG .  No ALCOHOL EXCEPT ONE OUNCE ON NEW YEARS EVE. NOTHING SINCE. EM

## 2020-02-18 NOTE — Telephone Encounter (Signed)
Okay.. continue with same plan.

## 2020-02-18 NOTE — Telephone Encounter (Signed)
Okay.. not likely the cause of the order. Hold tylenol, ETOH and supplements/vitamins and recheck in 2 weeks with repeat hepatic panel and hepatitis panel.

## 2020-02-18 NOTE — Telephone Encounter (Signed)
Is it okay if Arryn continues her probiotic or should she hold that as well??

## 2020-02-18 NOTE — Telephone Encounter (Addendum)
Kimberly Cruz notified  to continue as plan.  Okay for her to take her probiotic per Dr. Diona Browner.  Patient states understanding.

## 2020-02-18 NOTE — Telephone Encounter (Signed)
Kimberly Cruz notified as instructed by telephone.  Lab appointment scheduled for 03/04/20 at 7:50 am.   She wanted to make Dr. Diona Browner aware that her Dad has liver issues and her mom had gallbladder issues and had to have her gallbladder removed when she was in her 34s.  She also states that she was only taking the vitamins when she was sick so she has not taken any of these in the last 2 weeks.  She is currently only taking a probiotic and tylenol 1 tablets once or twice a week.  She is also asking if there are certain foods that she should be eating or avoiding that could cause elevated liver enzymes.  Please advise.

## 2020-02-25 ENCOUNTER — Other Ambulatory Visit: Payer: Self-pay | Admitting: Primary Care

## 2020-02-25 DIAGNOSIS — R7989 Other specified abnormal findings of blood chemistry: Secondary | ICD-10-CM

## 2020-02-25 DIAGNOSIS — R945 Abnormal results of liver function studies: Secondary | ICD-10-CM

## 2020-03-02 ENCOUNTER — Telehealth: Payer: Self-pay | Admitting: Family Medicine

## 2020-03-02 DIAGNOSIS — R7989 Other specified abnormal findings of blood chemistry: Secondary | ICD-10-CM

## 2020-03-02 NOTE — Telephone Encounter (Signed)
-----   Message from Cloyd Stagers, RT sent at 02/25/2020 10:51 AM EST ----- Regarding: Lab Orders for Wednesday 2.16.2022 Please place lab orders for Wednesday 2.16.2022, appt notes state "Hepatic and Hepatitis Panel" This is a Allie Bossier pt.  I originally sent the request to her.  She has not seen the pt in over a year and wanted to see pt before ordering labs.  When Alice called the pt to schedule an appt w Anda Kraft, the pt said you were the one suggesting these labs... Please place orders or advise how to proceed.  Thanks Tam

## 2020-03-04 ENCOUNTER — Other Ambulatory Visit (INDEPENDENT_AMBULATORY_CARE_PROVIDER_SITE_OTHER): Payer: Managed Care, Other (non HMO)

## 2020-03-04 ENCOUNTER — Other Ambulatory Visit: Payer: Managed Care, Other (non HMO)

## 2020-03-04 ENCOUNTER — Other Ambulatory Visit: Payer: Self-pay

## 2020-03-04 DIAGNOSIS — R7989 Other specified abnormal findings of blood chemistry: Secondary | ICD-10-CM | POA: Diagnosis not present

## 2020-03-04 LAB — HEPATIC FUNCTION PANEL
ALT: 70 U/L — ABNORMAL HIGH (ref 0–35)
AST: 28 U/L (ref 0–37)
Albumin: 4.2 g/dL (ref 3.5–5.2)
Alkaline Phosphatase: 68 U/L (ref 39–117)
Bilirubin, Direct: 0.1 mg/dL (ref 0.0–0.3)
Total Bilirubin: 0.7 mg/dL (ref 0.2–1.2)
Total Protein: 7.1 g/dL (ref 6.0–8.3)

## 2020-03-05 LAB — HEPATITIS PANEL, ACUTE
Hep A IgM: NONREACTIVE
Hep B C IgM: NONREACTIVE
Hepatitis B Surface Ag: NONREACTIVE
Hepatitis C Ab: NONREACTIVE
SIGNAL TO CUT-OFF: 0.01 (ref <1.00)

## 2020-03-06 ENCOUNTER — Telehealth: Payer: Self-pay | Admitting: Primary Care

## 2020-03-06 DIAGNOSIS — R7989 Other specified abnormal findings of blood chemistry: Secondary | ICD-10-CM

## 2020-03-06 NOTE — Addendum Note (Signed)
Addended by: Pleas Koch on: 03/06/2020 01:38 PM   Modules accepted: Orders

## 2020-03-06 NOTE — Telephone Encounter (Signed)
Patient has called in stating that she approves an ultrasound if necessary. She couldn't respond to your message on my chart but wanted [place] to know that she approves of it.

## 2020-03-06 NOTE — Telephone Encounter (Signed)
Call patient.Marland Kitchen let her know [place] will be arranged. If unremarkable, I will recommend follow up with her PCP Allie Bossier.

## 2020-03-06 NOTE — Telephone Encounter (Signed)
[name] notified as instructed by telephone.  Patient states understanding.

## 2020-03-06 NOTE — Addendum Note (Signed)
Addended byEliezer Lofts E on: 03/06/2020 02:03 PM   Modules accepted: Orders

## 2020-03-06 NOTE — Telephone Encounter (Addendum)
Will send to Dr. Diona Browner who is evaluating. Dr. Diona Browner, this is the patient who refused to see me for follow up.

## 2020-03-10 ENCOUNTER — Ambulatory Visit
Admission: RE | Admit: 2020-03-10 | Discharge: 2020-03-10 | Disposition: A | Discharge status: Home / Self Care | Payer: Managed Care, Other (non HMO) | Source: Ambulatory Visit | Attending: Family Medicine | Admitting: Family Medicine

## 2020-03-10 DIAGNOSIS — R7989 Other specified abnormal findings of blood chemistry: Secondary | ICD-10-CM

## 2020-03-23 ENCOUNTER — Other Ambulatory Visit: Payer: Managed Care, Other (non HMO)

## 2020-04-22 LAB — HIV ANTIBODY (ROUTINE TESTING W REFLEX): HIV 1&2 Ab, 4th Generation: NEGATIVE

## 2021-01-17 NOTE — L&D Delivery Note (Signed)
Delivery Note  Date of delivery: 12/30/2021 Estimated Date of Delivery: 01/11/22 Patient's last menstrual period was 04/06/2021 (exact date). EGA: [redacted]w[redacted]d Delivery Note At 5:22 PM a viable female was delivered via Vaginal, Spontaneous (Presentation: Left Occiput Anterior).  APGAR: 8, 9; weight pending.  Placenta status: Spontaneous, Intact.  Cord: 3 vessels with the following complications: None.    First Stage: Labor onset: unknown Augmentation : Pitocin Analgesia /Anesthesia intrapartum: Epidural SROM at 2Wellspresented to L&D with SROM. She was augmented with pitocin. Epidural placed for pain relief.   Second Stage: Complete dilation at 1702 Onset of pushing at 1716 FHR second stage Cat I Delivery at 1722 on 12/30/2021  She progressed to complete and had a spontaneous vaginal birth of a live female over an intact perineum. The fetal head was delivered in OA position with restitution to LOA. 1 loose nuchal cord, resolved with summersault maneuver. Anterior then posterior shoulders delivered spontaneously. Baby placed on mom's abdomen and attended to by transition RN. Cord clamped and cut after 1+ mins by FOB.   Third Stage: Placenta delivered intact with 3VC at 1736 Placenta disposition: routine disposal  Uterine tone firm / bleeding min IV pitocin given for hemorrhage prophylaxis  Anesthesia: Epidural Episiotomy: None Lacerations: None Suture Repair: n/a Est. Blood Loss (mL): 1409 Complications: none  Mom to postpartum.  Baby to Couplet care / Skin to Skin.  Newborn: Birth Weight: pending  Apgar Scores: 8, 9 Feeding planned: Breastfeeding   JClydene Laming CNM 12/30/2021 5:49 PM

## 2021-01-21 ENCOUNTER — Ambulatory Visit: Payer: Managed Care, Other (non HMO) | Admitting: Primary Care

## 2021-01-29 ENCOUNTER — Ambulatory Visit (INDEPENDENT_AMBULATORY_CARE_PROVIDER_SITE_OTHER): Payer: 59 | Admitting: Primary Care

## 2021-01-29 ENCOUNTER — Other Ambulatory Visit: Payer: Self-pay

## 2021-01-29 ENCOUNTER — Encounter: Payer: Self-pay | Admitting: Primary Care

## 2021-01-29 ENCOUNTER — Ambulatory Visit (INDEPENDENT_AMBULATORY_CARE_PROVIDER_SITE_OTHER)
Admission: RE | Admit: 2021-01-29 | Discharge: 2021-01-29 | Disposition: A | Discharge status: Home / Self Care | Payer: 59 | Source: Ambulatory Visit | Attending: Primary Care | Admitting: Primary Care

## 2021-01-29 VITALS — BP 138/74 | HR 89 | Temp 98.7°F | Ht 63.0 in | Wt 156.0 lb

## 2021-01-29 DIAGNOSIS — R053 Chronic cough: Secondary | ICD-10-CM

## 2021-01-29 DIAGNOSIS — R051 Acute cough: Secondary | ICD-10-CM | POA: Insufficient documentation

## 2021-01-29 MED ORDER — OMEPRAZOLE 40 MG PO CPDR: 40.0000 mg | DELAYED_RELEASE_CAPSULE | Freq: Every day | ORAL | 0 refills | Status: DC

## 2021-01-29 MED ORDER — FLUTICASONE PROPIONATE HFA 110 MCG/ACT IN AERO: 1.0000 | INHALATION_SPRAY | Freq: Two times a day (BID) | RESPIRATORY_TRACT | 0 refills | Status: DC

## 2021-01-29 NOTE — Progress Notes (Signed)
Subjective:    Patient ID: Kimberly Cruz, female    DOB: [birth year], 36 y.o.   MRN: [id number]  HPI  Kimberly Cruz is a very pleasant 36 y.o. female with a history of Covid-19 infection, thyroid nodules who presents today to discuss cough.  Chronic history of dry, tickle cough to the throat which began years ago. Historically worse in the morning and at night, during colder weather. She will go into coughing fits often during those times.   She contracted Covid-19 in Thanksgiving with symptoms of diarrhea, fatigue, headache. Symptoms resolved after two days. Since Covid-19 her chronic cough has increased in frequency.   She tried an albuterol nebulized treatment that belonged to her boyfriend's child about 2 weeks ago. This relived her cough for 1 day. She's also tried an albuterol handheld inhaler several times recently without improvement.   She denies a childhood history of asthma, esophageal burning, belching, epigastric pain.   She grew up around second hand smoker from her grandparents who were smokers.   BP Readings from Last 3 Encounters:  01/29/21 138/74  02/14/20 128/78  12/11/18 110/74      Review of Systems  Constitutional:  Negative for chills and fever.  HENT:  Negative for congestion, postnasal drip and sore throat.   Respiratory:  Positive for cough. Negative for chest tightness, shortness of breath and wheezing.   Cardiovascular:  Negative for chest pain.        Past Medical History:  Diagnosis Date   FHx: migraine headaches    History of stomach ulcers    Hyperlipidemia    PONV (postoperative nausea and vomiting)    Tonsil, abscess     Social History   Socioeconomic History   Marital status: Single    Spouse name: Not on file   Number of children: Not on file   Years of education: Not on file   Highest education level: Not on file  Occupational History   Not on file  Tobacco Use   Smoking status: Never   Smokeless tobacco: Never  Substance  and Sexual Activity   Alcohol use: Yes   Drug use: No   Sexual activity: Yes    Birth control/protection: Pill  Other Topics Concern   Not on file  Social History Narrative   Single.   No children.   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    Enjoys hiking, camping, travel.   Social Determinants of Health   Financial Resource Strain: Not on file  Food Insecurity: Not on file  Transportation Needs: Not on file  Physical Activity: Not on file  Stress: Not on file  Social Connections: Not on file  Intimate Partner Violence: Not on file    Past Surgical History:  Procedure Laterality Date   ANKLE SURGERY  2000   SEPTOPLASTY  06/2014   TONSILLECTOMY N/A 07/17/2015   Procedure: TONSILLECTOMY;  Surgeon: Beverly Gust, MD;  Location: [facility];  Service: ENT;  Laterality: N/A;    Family History  Problem Relation Age of Onset   Diverticulitis Father    Diabetes Maternal Grandmother    Heart disease Maternal Grandmother    Hypertension Maternal Grandmother    Diverticulitis Paternal Grandmother    Prostate cancer Maternal Grandfather     Allergies  Allergen Reactions   Zithromax [Azithromycin] Swelling and Anaphylaxis    No current outpatient medications on file prior to visit.   No current facility-administered medications on file prior to visit.  BP 138/74    Pulse 89    Temp 98.7 F (37.1 C) (Temporal)    Ht 5\' 3"  (1.6 m)    Wt 156 lb (70.8 kg)    SpO2 97%    BMI 27.63 kg/m  Objective:   Physical Exam Cardiovascular:     Rate and Rhythm: Normal rate and regular rhythm.  Pulmonary:     Effort: Pulmonary effort is normal.     Breath sounds: Normal breath sounds.     Comments: No cough during exam. Musculoskeletal:     Cervical back: Neck supple.  Skin:    General: Skin is warm and dry.  Psychiatric:        Mood and Affect: Mood normal.          Assessment & Plan:      This visit occurred during the SARS-CoV-2 public  health emergency.  Safety protocols were in place, including screening questions prior to the visit, additional usage of staff PPE, and extensive cleaning of exam room while observing appropriate contact time as indicated for disinfecting solutions.

## 2021-01-29 NOTE — Assessment & Plan Note (Signed)
Exam benign today.  Differentials include allergies, GERD, reactive airway disease/asthma.  Checking chest xray today.  Rx for omeprazole 40 mg sent to pharmacy to try. Cough is worse at night and in AM.  Interestingly she had a temporary resolve after use of a nebulized albuterol. Will also prescribe Flovent 110 mcg for her to use BID.  Next step would be to add daily antihistamine.  She will update in a few weeks.

## 2021-01-29 NOTE — Patient Instructions (Signed)
Complete xray(s) prior to leaving today. I will notify you of your results once received.  Start omeprazole 40 mg for cough. Take this at bedtime, every night.  Start fluticasone (Flovent) 110 mcg for cough. Inhale 1 puff into the lungs twice daily. Rinse your mouth after each use.   Please update me in a few weeks.  It was a pleasure to see you today!

## 2021-03-24 ENCOUNTER — Encounter: Payer: Self-pay | Admitting: Primary Care

## 2021-03-24 ENCOUNTER — Encounter: Payer: Self-pay | Admitting: *Deleted

## 2021-03-24 ENCOUNTER — Ambulatory Visit (INDEPENDENT_AMBULATORY_CARE_PROVIDER_SITE_OTHER): Payer: 59 | Admitting: Primary Care

## 2021-03-24 ENCOUNTER — Other Ambulatory Visit: Payer: Self-pay

## 2021-03-24 VITALS — BP 122/60 | HR 87 | Temp 98.6°F | Ht 62.0 in | Wt 149.0 lb

## 2021-03-24 DIAGNOSIS — Z0000 Encounter for general adult medical examination without abnormal findings: Secondary | ICD-10-CM | POA: Diagnosis not present

## 2021-03-24 DIAGNOSIS — R7989 Other specified abnormal findings of blood chemistry: Secondary | ICD-10-CM | POA: Diagnosis not present

## 2021-03-24 DIAGNOSIS — E042 Nontoxic multinodular goiter: Secondary | ICD-10-CM

## 2021-03-24 DIAGNOSIS — R053 Chronic cough: Secondary | ICD-10-CM | POA: Diagnosis not present

## 2021-03-24 LAB — CBC
HCT: 42.1 % (ref 36.0–46.0)
Hemoglobin: 14.2 g/dL (ref 12.0–15.0)
MCHC: 33.6 g/dL (ref 30.0–36.0)
MCV: 88.7 fl (ref 78.0–100.0)
Platelets: 315 10*3/uL (ref 150.0–400.0)
RBC: 4.75 Mil/uL (ref 3.87–5.11)
RDW: 13.2 % (ref 11.5–15.5)
WBC: 8.4 10*3/uL (ref 4.0–10.5)

## 2021-03-24 LAB — COMPREHENSIVE METABOLIC PANEL
ALT: 18 U/L (ref 0–35)
AST: 12 U/L (ref 0–37)
Albumin: 4.5 g/dL (ref 3.5–5.2)
Alkaline Phosphatase: 77 U/L (ref 39–117)
BUN: 11 mg/dL (ref 6–23)
CO2: 28 mEq/L (ref 19–32)
Calcium: 9.5 mg/dL (ref 8.4–10.5)
Chloride: 103 mEq/L (ref 96–112)
Creatinine, Ser: 0.63 mg/dL (ref 0.40–1.20)
GFR: 116.28 mL/min (ref >60.00)
Glucose, Bld: 88 mg/dL (ref 70–99)
Potassium: 4.8 mEq/L (ref 3.5–5.1)
Sodium: 138 mEq/L (ref 135–145)
Total Bilirubin: 0.7 mg/dL (ref 0.2–1.2)
Total Protein: 6.8 g/dL (ref 6.0–8.3)

## 2021-03-24 LAB — LIPID PANEL
Cholesterol: 194 mg/dL (ref 0–200)
HDL: 55.8 mg/dL (ref >39.00)
LDL Cholesterol: 124 mg/dL — ABNORMAL HIGH (ref 0–99)
NonHDL: 137.83
Total CHOL/HDL Ratio: 3
Triglycerides: 71 mg/dL (ref 0.0–149.0)
VLDL: 14.2 mg/dL (ref 0.0–40.0)

## 2021-03-24 LAB — T4, FREE: Free T4: 0.99 ng/dL (ref 0.60–1.60)

## 2021-03-24 LAB — TSH: TSH: 0.79 u[IU]/mL (ref 0.35–5.50)

## 2021-03-24 NOTE — Assessment & Plan Note (Signed)
Thyroid labs pending today. ? ?No follow-up from ultrasound for thyroid nodules since 2019. ?Thyroid ultrasound ordered and pending. ?

## 2021-03-24 NOTE — Assessment & Plan Note (Signed)
Gradual improvement over the year, likely secondary to weight loss. ? ?Repeat liver enzyme testing pending. ?

## 2021-03-24 NOTE — Progress Notes (Signed)
? ?Subjective:  ? ? Patient ID: Kimberly Cruz, female    DOB: [birth year], 36 y.o.   MRN: [id number] ? ?HPI ? ?Kimberly Cruz is a very pleasant 36 y.o. female who presents today for complete physical and follow up of chronic conditions. ? ?Immunizations: ?-Tetanus: 2019 ?-Influenza: Never completed  ?-Covid-19: Never completed  ? ?Diet: Fair diet.  ?Exercise: No regular exercise. Active.  ? ?Eye exam: Completes annually  ?Dental exam: Completes semi-annually  ? ?Pap Smear: Completed in December 2020 ? ?BP Readings from Last 3 Encounters:  ?03/24/21 122/60  ?01/29/21 138/74  ?02/14/20 128/78  ? ?Wt Readings from Last 3 Encounters:  ?03/24/21 149 lb (67.6 kg)  ?01/29/21 156 lb (70.8 kg)  ?02/14/20 169 lb 12 oz (77 kg)  ? ? ? ? ? ?Review of Systems  ?Constitutional:  Negative for unexpected weight change.  ?HENT:  Negative for rhinorrhea.   ?Eyes:  Negative for visual disturbance.  ?Respiratory:  Negative for cough and shortness of breath.   ?Cardiovascular:  Negative for chest pain.  ?Gastrointestinal:  Negative for constipation and diarrhea.  ?Genitourinary:  Negative for difficulty urinating and menstrual problem.  ?Musculoskeletal:  Negative for arthralgias and myalgias.  ?Skin:  Negative for rash.  ?Allergic/Immunologic: Negative for environmental allergies.  ?Neurological:  Negative for dizziness and headaches.  ?Psychiatric/Behavioral:  The patient is not nervous/anxious.   ? ?   ? ? ?Past Medical History:  ?Diagnosis Date  ? FHx: migraine headaches   ? History of stomach ulcers   ? Hyperlipidemia   ? PONV (postoperative nausea and vomiting)   ? Tonsil, abscess   ? ? ?Social History  ? ?Socioeconomic History  ? Marital status: Single  ?  Spouse name: Not on file  ? Number of children: Not on file  ? Years of education: Not on file  ? Highest education level: Not on file  ?Occupational History  ? Not on file  ?Tobacco Use  ? Smoking status: Never  ? Smokeless tobacco: Never  ?Substance and Sexual Activity  ? Alcohol  use: Yes  ? Drug use: No  ? Sexual activity: Yes  ?  Birth control/protection: Pill  ?Other Topics Concern  ? Not on file  ?Social History Narrative  ? Single.  ? No children.  ? 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   ? Enjoys hiking, camping, travel.  ? ?Social Determinants of Health  ? ?Financial Resource Strain: Not on file  ?Food Insecurity: Not on file  ?Transportation Needs: Not on file  ?Physical Activity: Not on file  ?Stress: Not on file  ?Social Connections: Not on file  ?Intimate Partner Violence: Not on file  ? ? ?Past Surgical History:  ?Procedure Laterality Date  ? ANKLE SURGERY  2000  ? SEPTOPLASTY  06/2014  ? TONSILLECTOMY N/A 07/17/2015  ? Procedure: TONSILLECTOMY;  Surgeon: Beverly Gust, MD;  Location: [facility];  Service: ENT;  Laterality: N/A;  ? ? ?Family History  ?Problem Relation Age of Onset  ? Diverticulitis Father   ? Diabetes Maternal Grandmother   ? Heart disease Maternal Grandmother   ? Hypertension Maternal Grandmother   ? Diverticulitis Paternal Grandmother   ? Prostate cancer Maternal Grandfather   ? ? ?Allergies  ?Allergen Reactions  ? Zithromax [Azithromycin] Swelling and Anaphylaxis  ? ? ?No current outpatient medications on file prior to visit.  ? ?No current facility-administered medications on file prior to visit.  ? ? ?BP 122/60   Pulse 87  Temp 98.6 ?F (37 ?C) (Temporal)   Ht '5\' 2"'$  (1.575 m)   Wt 149 lb (67.6 kg)   LMP 03/12/2021   SpO2 97%   BMI 27.25 kg/m?  ?Objective:  ? Physical Exam ?HENT:  ?   Right Ear: Tympanic membrane and ear canal normal.  ?   Left Ear: Tympanic membrane and ear canal normal.  ?   Nose: Nose normal.  ?Eyes:  ?   Conjunctiva/sclera: Conjunctivae normal.  ?   Pupils: Pupils are equal, round, and reactive to light.  ?Neck:  ?   Thyroid: No thyromegaly.  ?Cardiovascular:  ?   Rate and Rhythm: Normal rate and regular rhythm.  ?   Heart sounds: No murmur heard. ?Pulmonary:  ?   Effort: Pulmonary effort is normal.  ?    Breath sounds: Normal breath sounds. No rales.  ?Abdominal:  ?   General: Bowel sounds are normal.  ?   Palpations: Abdomen is soft.  ?   Tenderness: There is no abdominal tenderness.  ?Musculoskeletal:     ?   General: Normal range of motion.  ?   Cervical back: Neck supple.  ?Lymphadenopathy:  ?   Cervical: No cervical adenopathy.  ?Skin: ?   General: Skin is warm and dry.  ?   Findings: No rash.  ?Neurological:  ?   Mental Status: She is alert and oriented to person, place, and time.  ?   Cranial Nerves: No cranial nerve deficit.  ?   Deep Tendon Reflexes: Reflexes are normal and symmetric.  ?Psychiatric:     ?   Mood and Affect: Mood normal.  ? ? ? ? ? ?   ?Assessment & Plan:  ? ? ? ? ?This visit occurred during the SARS-CoV-2 public health emergency.  Safety protocols were in place, including screening questions prior to the visit, additional usage of staff PPE, and extensive cleaning of exam room while observing appropriate contact time as indicated for disinfecting solutions.  ?

## 2021-03-24 NOTE — Assessment & Plan Note (Signed)
Resolved. ? ?Longer on Flovent or omeprazole. ?

## 2021-03-24 NOTE — Patient Instructions (Signed)
Stop by the lab prior to leaving today. I will notify you of your results once received.  ? ?You will be contacted regarding your thyroid ultrasound.  Please let us know if you have not been contacted within two weeks.  ? ?It was a pleasure to see you today! ? ?Preventive Care 10-34 Years Old, Female ?Preventive care refers to lifestyle choices and visits with your health care provider that can promote health and wellness. Preventive care visits are also called wellness exams. ?What can I expect for my preventive care visit? ?Counseling ?During your preventive care visit, your health care provider may ask about your: ?Medical history, including: ?Past medical problems. ?Family medical history. ?Pregnancy history. ?Current health, including: ?Menstrual cycle. ?Method of birth control. ?Emotional well-being. ?Home life and relationship well-being. ?Sexual activity and sexual health. ?Lifestyle, including: ?Alcohol, nicotine or tobacco, and drug use. ?Access to firearms. ?Diet, exercise, and sleep habits. ?Work and work Statistician. ?Sunscreen use. ?Safety issues such as seatbelt and bike helmet use. ?Physical exam ?Your health care provider may check your: ?Height and weight. These may be used to calculate your BMI (body mass index). BMI is a measurement that tells if you are at a healthy weight. ?Waist circumference. This measures the distance around your waistline. This measurement also tells if you are at a healthy weight and may help predict your risk of certain diseases, such as type 2 diabetes and high blood pressure. ?Heart rate and blood pressure. ?Body temperature. ?Skin for abnormal spots. ?What immunizations do I need? ?Vaccines are usually given at various ages, according to a schedule. Your health care provider will recommend vaccines for you based on your age, medical history, and lifestyle or other factors, such as travel or where you work. ?What tests do I need? ?Screening ?Your health care provider  may recommend screening tests for certain conditions. This may include: ?Pelvic exam and Pap test. ?Lipid and cholesterol levels. ?Diabetes screening. This is done by checking your blood sugar (glucose) after you have not eaten for a while (fasting). ?Hepatitis B test. ?Hepatitis C test. ?HIV (human immunodeficiency virus) test. ?STI (sexually transmitted infection) testing, if you are at risk. ?BRCA-related cancer screening. This may be done if you have a family history of breast, ovarian, tubal, or peritoneal cancers. ?Talk with your health care provider about your test results, treatment options, and if necessary, the need for more tests. ?Follow these instructions at home: ?Eating and drinking ? ?Eat a healthy diet that includes fresh fruits and vegetables, whole grains, lean protein, and low-fat dairy products. ?Take vitamin and mineral supplements as recommended by your health care provider. ?Do not drink alcohol if: ?Your health care provider tells you not to drink. ?You are pregnant, may be pregnant, or are planning to become pregnant. ?If you drink alcohol: ?Limit how much you have to 0-1 drink a day. ?Know how much alcohol is in your drink. In the [place] one drink equals one 12 oz bottle of beer (355 mL), one 5 oz glass of wine (148 mL), or one 1? oz glass of hard liquor (44 mL). ?Lifestyle ?Brush your teeth every morning and night with fluoride toothpaste. Floss one time each day. ?Exercise for at least 30 minutes 5 or more days each week. ?Do not use any products that contain nicotine or tobacco. These products include cigarettes, chewing tobacco, and vaping devices, such as e-cigarettes. If you need help quitting, ask your health care provider. ?Do not use drugs. ?If you are sexually active, practice  safe sex. Use a condom or other form of protection to prevent STIs. ?If you do not wish to become pregnant, use a form of birth control. If you plan to become pregnant, see your health care provider for a  prepregnancy visit. ?Find healthy ways to manage stress, such as: ?Meditation, yoga, or listening to music. ?Journaling. ?Talking to a trusted person. ?Spending time with friends and family. ?Minimize exposure to UV radiation to reduce your risk of skin cancer. ?Safety ?Always wear your seat belt while driving or riding in a vehicle. ?Do not drive: ?If you have been drinking alcohol. Do not ride with someone who has been drinking. ?If you have been using any mind-altering substances or drugs. ?While texting. ?When you are tired or distracted. ?Wear a helmet and other protective equipment during sports activities. ?If you have firearms in your house, make sure you follow all gun safety procedures. ?Seek help if you have been physically or sexually abused. ?What's next? ?Go to your health care provider once a year for an annual wellness visit. ?Ask your health care provider how often you should have your eyes and teeth checked. ?Stay up to date on all vaccines. ?This information is not intended to replace advice given to you by your health care provider. Make sure you discuss any questions you have with your health care provider. ?Document Revised: 07/01/2020 Document Reviewed: 07/01/2020 ?Elsevier Patient Education ? [employer] ? ?

## 2021-03-24 NOTE — Assessment & Plan Note (Signed)
Tetanus vaccine up-to-date. ?Pap smear up-to-date. ? ?Encouraged healthy diet and regular exercise. ? ?Exam today stable. ?Labs pending. ?

## 2021-05-03 ENCOUNTER — Telehealth: Payer: Self-pay | Admitting: Obstetrics and Gynecology

## 2021-05-03 NOTE — Telephone Encounter (Signed)
Pt seen by Jayme Cloud about 1 week ago, given Rx for Flagyl to treat BV. Pt missed period and took home UPT today which was positive. Wants to know safety of antibiotics and pregnancy. Approx LMP 04/03/21. ? ? ?Reviewed Flagyl is category B and safe in pregnancy. Encouraged to take course of Abx and call office in AM to schedule amenorrhea appt.  ? ?Francetta Found, CNM ?05/03/2021 ?5:18 PM ? ?

## 2021-05-04 ENCOUNTER — Other Ambulatory Visit (INDEPENDENT_AMBULATORY_CARE_PROVIDER_SITE_OTHER): Payer: 59

## 2021-05-04 ENCOUNTER — Other Ambulatory Visit (INDEPENDENT_AMBULATORY_CARE_PROVIDER_SITE_OTHER): Payer: 59 | Admitting: Primary Care

## 2021-05-04 DIAGNOSIS — Z3201 Encounter for pregnancy test, result positive: Secondary | ICD-10-CM

## 2021-05-04 LAB — HCG, QUANTITATIVE, PREGNANCY: Quantitative HCG: 359.35 m[IU]/mL

## 2021-05-31 NOTE — Telephone Encounter (Signed)
Called patient regarding her US Thyroid ?She states that she is going to discuss this with her OB and if she decides to move forward and get this done she will call to schedule.  ? ?If not then she will send a message to Anda Kraft making her aware  ? ?Nothing further needed.  ? ?

## 2021-09-01 ENCOUNTER — Ambulatory Visit
Admission: RE | Admit: 2021-09-01 | Discharge: 2021-09-01 | Disposition: A | Discharge status: Home / Self Care | Payer: 59 | Source: Ambulatory Visit | Attending: Primary Care | Admitting: Primary Care

## 2021-09-01 DIAGNOSIS — E042 Nontoxic multinodular goiter: Secondary | ICD-10-CM

## 2021-12-29 ENCOUNTER — Inpatient Hospital Stay
Admission: EM | Admit: 2021-12-29 | Discharge: 2021-12-31 | DRG: 806 | Disposition: A | Discharge status: Home / Self Care | Payer: Managed Care, Other (non HMO) | Attending: Certified Nurse Midwife | Admitting: Certified Nurse Midwife

## 2021-12-29 DIAGNOSIS — R030 Elevated blood-pressure reading, without diagnosis of hypertension: Secondary | ICD-10-CM | POA: Diagnosis not present

## 2021-12-29 DIAGNOSIS — Z3A38 38 weeks gestation of pregnancy: Secondary | ICD-10-CM

## 2021-12-29 DIAGNOSIS — D62 Acute posthemorrhagic anemia: Secondary | ICD-10-CM | POA: Diagnosis not present

## 2021-12-29 DIAGNOSIS — O9081 Anemia of the puerperium: Secondary | ICD-10-CM | POA: Diagnosis not present

## 2021-12-29 DIAGNOSIS — O26893 Other specified pregnancy related conditions, third trimester: Secondary | ICD-10-CM | POA: Diagnosis present

## 2021-12-29 DIAGNOSIS — O4290 Premature rupture of membranes, unspecified as to length of time between rupture and onset of labor, unspecified weeks of gestation: Secondary | ICD-10-CM | POA: Diagnosis present

## 2021-12-29 DIAGNOSIS — O99893 Other specified diseases and conditions complicating puerperium: Secondary | ICD-10-CM | POA: Diagnosis not present

## 2021-12-29 DIAGNOSIS — K219 Gastro-esophageal reflux disease without esophagitis: Secondary | ICD-10-CM | POA: Diagnosis present

## 2021-12-29 DIAGNOSIS — O9962 Diseases of the digestive system complicating childbirth: Secondary | ICD-10-CM | POA: Diagnosis present

## 2021-12-29 MED ORDER — ONDANSETRON HCL 4 MG/2ML IJ SOLN
4.0000 mg | Freq: Four times a day (QID) | INTRAMUSCULAR | Status: DC | PRN
  Administered 2021-12-30: 4 mg via INTRAVENOUS
  Filled 2021-12-29: qty 2

## 2021-12-29 MED ORDER — OXYTOCIN BOLUS FROM INFUSION
333.0000 mL | Freq: Once | INTRAVENOUS | Status: AC
  Administered 2021-12-30: 333 mL via INTRAVENOUS

## 2021-12-29 MED ORDER — FENTANYL CITRATE (PF) 100 MCG/2ML IJ SOLN: 50.0000 ug | INTRAMUSCULAR | Status: DC | PRN

## 2021-12-29 MED ORDER — LACTATED RINGERS IV SOLN: INTRAVENOUS | Status: DC

## 2021-12-29 MED ORDER — LACTATED RINGERS IV SOLN: 500.0000 mL | INTRAVENOUS | Status: DC | PRN

## 2021-12-29 MED ORDER — ACETAMINOPHEN 325 MG PO TABS
650.0000 mg | ORAL_TABLET | ORAL | Status: DC | PRN
  Administered 2021-12-30: 650 mg via ORAL
  Filled 2021-12-29: qty 2

## 2021-12-29 MED ORDER — LIDOCAINE HCL (PF) 1 % IJ SOLN: 30.0000 mL | INTRAMUSCULAR | Status: DC | PRN

## 2021-12-29 MED ORDER — OXYTOCIN-SODIUM CHLORIDE 30-0.9 UT/500ML-% IV SOLN
2.5000 [IU]/h | INTRAVENOUS | Status: DC
  Filled 2021-12-29: qty 500

## 2021-12-29 MED ORDER — SOD CITRATE-CITRIC ACID 500-334 MG/5ML PO SOLN: 30.0000 mL | ORAL | Status: DC | PRN

## 2021-12-29 NOTE — H&P (Signed)
OB History & Physical   History of Present Illness:  Chief Complaint:   HPI:  Kimberly Cruz is a 36 y.o. G0P0000 female at 39w1ddated by LMP.  She presents to L&D for leaking amniotic fluid.  She reports good fetal movement, denies contractions. Reports this evening felt a "pop" then a large gush of fluid and has continued leaking since around 2200.   Pregnancy Issues: 1. Vitamin D deficiency  2. Reflux: Pepcid and Prilosec 3. Anemia 4. Elevated 1hr GTT 5. Thyroid nodule    Maternal Medical History:   Past Medical History:  Diagnosis Date   FHx: migraine headaches    Gastritis 03/23/2018   History of stomach ulcers    Hyperlipidemia    PONV (postoperative nausea and vomiting)    Tonsil, abscess     Past Surgical History:  Procedure Laterality Date   ANKLE SURGERY  2000   SEPTOPLASTY  06/2014   TONSILLECTOMY N/A 07/17/2015   Procedure: TONSILLECTOMY;  Surgeon: CBeverly Gust MD;  [facility]  Service: ENT;  Laterality: N/A;    Allergies  Allergen Reactions   Zithromax [Azithromycin] Swelling and Anaphylaxis    Prior to Admission medications   Not on File    [facility] She  reports that she has never smoked. She has never used smokeless tobacco. She reports current alcohol use. She reports that she does not use drugs.  Family History: family history includes Diabetes in her maternal grandmother; Diverticulitis in her father and paternal grandmother; Heart disease in her maternal grandmother; Hypertension in her maternal grandmother; Prostate cancer in her maternal grandfather.   Review of Systems: A full review of systems was performed and negative except as noted in the HPI.     Physical Exam:  Vital Signs: There were no vitals taken for this visit. General: no acute distress.  HEENT: normocephalic, atraumatic Heart: regular rate & rhythm.  No murmurs/rubs/gallops Lungs: clear to  auscultation bilaterally, normal respiratory effort Abdomen: soft, gravid, non-tender;  EFW: 7lb Pelvic:   External: Normal external female genitalia  Cervix: Dilation: 2.5 / Effacement (%): 90 / Station: -2    Extremities: non-tender, symmetric, mild edema bilaterally.  DTRs: +2  Neurologic: Alert & oriented x 3.    No results found for this or any previous visit (from the past 24 hour(s)).  Pertinent Results:  Prenatal Labs: Blood type/Rh A pos  Antibody screen neg  Rubella Immune  Varicella Immune  RPR NR  HBsAg Neg  HIV NR  GC neg  Chlamydia neg  Genetic screening negative  1 hour GTT 150  3 hour GTT 80, 206, 144, 48   GBS negative   FHT: 135bpm, moderate variability, accelerations present, no decelerations TOCO: occasional, mild to palpation SVE:  Dilation: 2.5 / Effacement (%): 90 / Station: -2    Cephalic by leopolds  No results found.  Assessment:  LOlivene Cookstonis a 36y.o. G0P0000 female at 35w1dith SROM.   Plan:  1. Admit to Labor & Delivery; consents reviewed and obtained  2. Fetal Well being  - Fetal Tracing: Category I tracing - Group B Streptococcus ppx indicated: n/a, GBS negative - Presentation: vertex confirmed by SVE   3. Routine OB: - Prenatal labs reviewed, as above - Rh pos - CBC, T&S, RPR on admit - Clear fluids, saline lock  4. Monitoring of Labor -  Contractions occasional, external toco in place -  Discussed augmentation vs expectant  management. She is GBS negative, so can follow expectant management for 6hrs or can augment with pitocin. After thinking about her options, she requests expectant management for about 6 hours. If contractions are still irregular at 6 hours, recommend pitocin augmentation at that time. She is agreeable with POC.  -  Plan for continuous fetal monitoring  -  Maternal pain control as desired; undecided about pain management options, but open to all options. - Anticipate vaginal delivery  5. Post Partum  Planning: - Infant feeding: breastfeeding - Contraception: husband to get vasectomy  Kimberly Cruz 12/29/21 11:36 PM

## 2021-12-30 ENCOUNTER — Inpatient Hospital Stay: Payer: Managed Care, Other (non HMO) | Admitting: Anesthesiology

## 2021-12-30 ENCOUNTER — Other Ambulatory Visit: Payer: Self-pay

## 2021-12-30 ENCOUNTER — Encounter: Payer: Self-pay | Admitting: Obstetrics and Gynecology

## 2021-12-30 LAB — TYPE AND SCREEN
ABO/RH(D): A POS
Antibody Screen: NEGATIVE

## 2021-12-30 LAB — COMPREHENSIVE METABOLIC PANEL
ALT: 16 U/L (ref 0–44)
AST: 21 U/L (ref 15–41)
Albumin: 2.7 g/dL — ABNORMAL LOW (ref 3.5–5.0)
Alkaline Phosphatase: 165 U/L — ABNORMAL HIGH (ref 38–126)
Anion gap: 4 — ABNORMAL LOW (ref 5–15)
BUN: 7 mg/dL (ref 6–20)
CO2: 23 mmol/L (ref 22–32)
Calcium: 8.6 mg/dL — ABNORMAL LOW (ref 8.9–10.3)
Chloride: 108 mmol/L (ref 98–111)
Creatinine, Ser: 0.5 mg/dL (ref 0.44–1.00)
GFR, Estimated: >60 mL/min (ref >60)
Glucose, Bld: 87 mg/dL (ref 70–99)
Potassium: 3.8 mmol/L (ref 3.5–5.1)
Sodium: 135 mmol/L (ref 135–145)
Total Bilirubin: 0.4 mg/dL (ref 0.3–1.2)
Total Protein: 5.6 g/dL — ABNORMAL LOW (ref 6.5–8.1)

## 2021-12-30 LAB — RPR: RPR Ser Ql: NONREACTIVE

## 2021-12-30 LAB — CBC
HCT: 37.4 % (ref 36.0–46.0)
Hemoglobin: 12.7 g/dL (ref 12.0–15.0)
MCH: 29.3 pg (ref 26.0–34.0)
MCHC: 34 g/dL (ref 30.0–36.0)
MCV: 86.4 fL (ref 80.0–100.0)
Platelets: 197 10*3/uL (ref 150–400)
RBC: 4.33 MIL/uL (ref 3.87–5.11)
RDW: 13.8 % (ref 11.5–15.5)
WBC: 13.4 10*3/uL — ABNORMAL HIGH (ref 4.0–10.5)
nRBC: 0 % (ref 0.0–0.2)

## 2021-12-30 LAB — PROTEIN / CREATININE RATIO, URINE
Creatinine, Urine: 54 mg/dL
Protein Creatinine Ratio: 0.17 mg/mg{Cre} — ABNORMAL HIGH (ref 0.00–0.15)
Total Protein, Urine: 9 mg/dL

## 2021-12-30 LAB — ABO/RH: ABO/RH(D): A POS

## 2021-12-30 MED ORDER — ACETAMINOPHEN 325 MG PO TABS: 650.0000 mg | ORAL_TABLET | ORAL | Status: DC | PRN

## 2021-12-30 MED ORDER — FENTANYL-BUPIVACAINE-NACL 0.5-0.125-0.9 MG/250ML-% EP SOLN
EPIDURAL | Status: AC
  Filled 2021-12-30: qty 250

## 2021-12-30 MED ORDER — CALCIUM CARBONATE ANTACID 500 MG PO CHEW
1.0000 | CHEWABLE_TABLET | ORAL | Status: DC | PRN
  Administered 2021-12-30 (×2): 200 mg via ORAL
  Filled 2021-12-30 (×2): qty 1

## 2021-12-30 MED ORDER — FERROUS SULFATE 325 (65 FE) MG PO TABS
325.0000 mg | ORAL_TABLET | Freq: Two times a day (BID) | ORAL | Status: DC
  Administered 2021-12-31 (×2): 325 mg via ORAL
  Filled 2021-12-30 (×2): qty 1

## 2021-12-30 MED ORDER — OXYTOCIN-SODIUM CHLORIDE 30-0.9 UT/500ML-% IV SOLN
1.0000 m[IU]/min | INTRAVENOUS | Status: DC
  Administered 2021-12-30: 2 m[IU]/min via INTRAVENOUS

## 2021-12-30 MED ORDER — TERBUTALINE SULFATE 1 MG/ML IJ SOLN: 0.2500 mg | Freq: Once | INTRAMUSCULAR | Status: DC | PRN

## 2021-12-30 MED ORDER — FENTANYL-BUPIVACAINE-NACL 0.5-0.125-0.9 MG/250ML-% EP SOLN
12.0000 mL/h | EPIDURAL | Status: DC | PRN
  Administered 2021-12-30: 12 mL/h via EPIDURAL

## 2021-12-30 MED ORDER — MISOPROSTOL 200 MCG PO TABS
ORAL_TABLET | ORAL | Status: AC
  Filled 2021-12-30: qty 4

## 2021-12-30 MED ORDER — SODIUM CHLORIDE 0.9 % IV SOLN
INTRAVENOUS | Status: DC | PRN
  Administered 2021-12-30 (×2): 5 mL via EPIDURAL

## 2021-12-30 MED ORDER — LIDOCAINE HCL (PF) 1 % IJ SOLN
INTRAMUSCULAR | Status: DC | PRN
  Administered 2021-12-30: 1 mL via SUBCUTANEOUS

## 2021-12-30 MED ORDER — WITCH HAZEL-GLYCERIN EX PADS: 1.0000 | MEDICATED_PAD | CUTANEOUS | Status: DC | PRN

## 2021-12-30 MED ORDER — LIDOCAINE HCL (PF) 1 % IJ SOLN
INTRAMUSCULAR | Status: AC
  Filled 2021-12-30: qty 30

## 2021-12-30 MED ORDER — BENZOCAINE-MENTHOL 20-0.5 % EX AERO: 1.0000 | INHALATION_SPRAY | CUTANEOUS | Status: DC | PRN

## 2021-12-30 MED ORDER — LACTATED RINGERS IV SOLN: 500.0000 mL | Freq: Once | INTRAVENOUS | Status: DC

## 2021-12-30 MED ORDER — COCONUT OIL OIL: 1.0000 | TOPICAL_OIL | Status: DC | PRN

## 2021-12-30 MED ORDER — LIDOCAINE-EPINEPHRINE (PF) 1.5 %-1:200000 IJ SOLN
INTRAMUSCULAR | Status: DC | PRN
  Administered 2021-12-30: 3 mL via EPIDURAL

## 2021-12-30 MED ORDER — ONDANSETRON HCL 4 MG PO TABS: 4.0000 mg | ORAL_TABLET | ORAL | Status: DC | PRN

## 2021-12-30 MED ORDER — EPHEDRINE 5 MG/ML INJ: 10.0000 mg | INTRAVENOUS | Status: DC | PRN

## 2021-12-30 MED ORDER — AMMONIA AROMATIC IN INHA
RESPIRATORY_TRACT | Status: AC
  Filled 2021-12-30: qty 10

## 2021-12-30 MED ORDER — SENNOSIDES-DOCUSATE SODIUM 8.6-50 MG PO TABS
2.0000 | ORAL_TABLET | Freq: Every day | ORAL | Status: DC
  Administered 2021-12-31: 2 via ORAL
  Filled 2021-12-30: qty 2

## 2021-12-30 MED ORDER — PHENYLEPHRINE 80 MCG/ML (10ML) SYRINGE FOR IV PUSH (FOR BLOOD PRESSURE SUPPORT): 80.0000 ug | PREFILLED_SYRINGE | INTRAVENOUS | Status: DC | PRN

## 2021-12-30 MED ORDER — OXYCODONE HCL 5 MG PO TABS: 10.0000 mg | ORAL_TABLET | ORAL | Status: DC | PRN

## 2021-12-30 MED ORDER — OXYTOCIN 10 UNIT/ML IJ SOLN
INTRAMUSCULAR | Status: AC
  Filled 2021-12-30: qty 2

## 2021-12-30 MED ORDER — SIMETHICONE 80 MG PO CHEW: 80.0000 mg | CHEWABLE_TABLET | ORAL | Status: DC | PRN

## 2021-12-30 MED ORDER — ONDANSETRON HCL 4 MG/2ML IJ SOLN: 4.0000 mg | INTRAMUSCULAR | Status: DC | PRN

## 2021-12-30 MED ORDER — PRENATAL MULTIVITAMIN CH
1.0000 | ORAL_TABLET | Freq: Every day | ORAL | Status: DC
  Filled 2021-12-30: qty 1

## 2021-12-30 MED ORDER — IBUPROFEN 600 MG PO TABS
600.0000 mg | ORAL_TABLET | Freq: Four times a day (QID) | ORAL | Status: DC
  Administered 2021-12-30 – 2021-12-31 (×4): 600 mg via ORAL
  Filled 2021-12-30 (×4): qty 1

## 2021-12-30 MED ORDER — DIPHENHYDRAMINE HCL 50 MG/ML IJ SOLN: 12.5000 mg | INTRAMUSCULAR | Status: DC | PRN

## 2021-12-30 MED ORDER — ZOLPIDEM TARTRATE 5 MG PO TABS: 5.0000 mg | ORAL_TABLET | Freq: Every evening | ORAL | Status: DC | PRN

## 2021-12-30 MED ORDER — TETANUS-DIPHTH-ACELL PERTUSSIS 5-2.5-18.5 LF-MCG/0.5 IM SUSY: 0.5000 mL | PREFILLED_SYRINGE | Freq: Once | INTRAMUSCULAR | Status: DC

## 2021-12-30 MED ORDER — DIPHENHYDRAMINE HCL 25 MG PO CAPS: 25.0000 mg | ORAL_CAPSULE | Freq: Four times a day (QID) | ORAL | Status: DC | PRN

## 2021-12-30 MED ORDER — DIBUCAINE (PERIANAL) 1 % EX OINT: 1.0000 | TOPICAL_OINTMENT | CUTANEOUS | Status: DC | PRN

## 2021-12-30 MED ORDER — OXYCODONE HCL 5 MG PO TABS: 5.0000 mg | ORAL_TABLET | ORAL | Status: DC | PRN

## 2021-12-30 NOTE — Anesthesia Procedure Notes (Signed)
Epidural Patient location during procedure: OB Start time: 12/30/2021 5:51 AM End time: 12/30/2021 5:54 AM  Staffing Anesthesiologist: Diahn Waidelich, Precious Haws, MD Performed: anesthesiologist   Preanesthetic Checklist Completed: patient identified, IV checked, site marked, risks and benefits discussed, surgical consent, monitors and equipment checked, pre-op evaluation and timeout performed  Epidural Patient position: sitting Prep: ChloraPrep Patient monitoring: heart rate, continuous pulse ox and blood pressure Approach: midline Location: L3-L4 Injection technique: LOR saline  Needle:  Needle type: Tuohy  Needle gauge: 17 G Needle length: 9 cm and 9 Needle insertion depth: 7 cm Catheter type: closed end flexible Catheter size: 19 Gauge Catheter at skin depth: 12 cm Test dose: negative and 1.5% lidocaine with Epi 1:200 K  Assessment Sensory level: T10 Events: blood not aspirated, no cerebrospinal fluid, injection not painful, no injection resistance, no paresthesia and negative IV test  Additional Notes 1 attempt Pt. Evaluated and documentation done after procedure finished. Patient identified. Risks/Benefits/Options discussed with patient including but not limited to bleeding, infection, nerve damage, paralysis, failed block, incomplete pain control, headache, blood pressure changes, nausea, vomiting, reactions to medication both or allergic, itching and postpartum back pain. Confirmed with bedside nurse the patient's most recent platelet count. Confirmed with patient that they are not currently taking any anticoagulation, have any bleeding history or any family history of bleeding disorders. Patient expressed understanding and wished to proceed. All questions were answered. Sterile technique was used throughout the entire procedure. Please see nursing notes for vital signs. Test dose was given through epidural catheter and negative prior to continuing to dose epidural or start  infusion. Warning signs of high block given to the patient including shortness of breath, tingling/numbness in hands, complete motor block, or any concerning symptoms with instructions to call for help. Patient was given instructions on fall risk and not to get out of bed. All questions and concerns addressed with instructions to call with any issues or inadequate analgesia.    Patient tolerated the insertion well without immediate complications.Reason for block:procedure for pain

## 2021-12-30 NOTE — Discharge Summary (Signed)
Obstetrical Discharge Summary  Patient Name: Kimberly Cruz DOB: April 23, 1985 MRN: [id number]  Date of Admission: 12/29/2021 Date of Delivery: 12/30/21 Delivered by: Linda Hedges, CNM  Date of Discharge: 12/31/2021  Primary OB: [facility] JYN:[id number] last menstrual period was 04/06/2021 (exact date). EDC Estimated Date of Delivery: 01/11/22 Gestational Age at Delivery: [redacted]w[redacted]d  Antepartum complications:  1. Vitamin D deficiency  2. Reflux: Pepcid and Prilosec 3. Anemia 4. Elevated 1hr GTT 5. Thyroid nodule    Admitting Diagnosis: Amniotic fluid leaking [O42.90]  Secondary Diagnosis: Patient Active Problem List   Diagnosis Date Noted   NSVD (normal spontaneous vaginal delivery) 12/30/2021   Elevated LFTs 03/24/2021   Chronic cough 01/29/2021   Multiple thyroid nodules 12/01/2017   Fatigue 12/01/2017   Cervical high risk HPV (human papillomavirus) test positive 02/03/2017   Preventative health care 06/28/2016    Discharge Diagnosis: Term Pregnancy Delivered      Augmentation: Pitocin Complications: None Intrapartum complications/course: see delivery note Delivery Type: spontaneous vaginal delivery Anesthesia: epidural anesthesia Placenta: spontaneous To Pathology: No  Laceration: none Episiotomy: none Newborn Data: Live born female "Jaxon" Birth Weight:  7#4 APGAR: 8, 9  Newborn Delivery   Birth date/time: 12/30/2021 17:22:00 Delivery type: Vaginal, Spontaneous      Postpartum Procedures: none Edinburgh:     12/31/2021    7:35 AM  EFlavia ShipperPostnatal Depression Scale Screening Tool  I have been able to laugh and see the funny side of things. 0  I have looked forward with enjoyment to things. 1  I have blamed myself unnecessarily when things went wrong. 1  I have been anxious or worried for no good reason. 2  I have felt scared or panicky for no good reason. 2  Things have been getting on top of me. 2  I have been so unhappy that I have had  difficulty sleeping. 0  I have felt sad or miserable. 1  I have been so unhappy that I have been crying. 0  The thought of harming myself has occurred to me. 0  Edinburgh Postnatal Depression Scale Total 9     Post partum course:  Patient had an uncomplicated postpartum course.  By time of discharge on PPD#1, her pain was controlled on oral pain medications; she had appropriate lochia and was ambulating, voiding without difficulty and tolerating regular diet. BP were monitored closely, she had elevated BP for several hours after delivery and at least 1 severe range prior to delivery. Labs were normal and pt was asymptomatic.  She was deemed stable for discharge to home.    Discharge Physical Exam:  BP 119/77   Pulse 92   Temp 98.2 F (36.8 C) (Oral)   Resp 18   Ht '5\' 2"'$  (1.575 m)   Wt 85.1 kg   LMP 04/06/2021 (Exact Date)   SpO2 98%   Breastfeeding Unknown   BMI 34.31 kg/m   General: NAD CV: RRR Pulm: CTABL, nl effort ABD: s/nd/nt, fundus firm and below the umbilicus Lochia: moderate Perineum:minimal edema/intact DVT Evaluation: LE non-ttp, no evidence of DVT on exam.  Hemoglobin  Date Value Ref Range Status  12/31/2021 10.6 (L) 12.0 - 15.0 g/dL Final  01/02/2015 14.4 11.1 - 15.9 g/dL Final   HCT  Date Value Ref Range Status  12/31/2021 31.6 (L) 36.0 - 46.0 % Final   Hematocrit  Date Value Ref Range Status  01/02/2015 42.0 34.0 - 46.6 % Final    Risk assessment for postpartum VTE and prophylactic treatment:  Very high risk factors: None High risk factors: None Moderate risk factors: None and BMI 30-40 kg/m2  Postpartum VTE prophylaxis with LMWH not indicated  Disposition: stable, discharge to home. Baby Feeding: breast feeding Baby Disposition: home with mom  Rh Immune globulin indicated: No Rubella vaccine given: was not indicated Varivax vaccine given: was not indicated Flu vaccine given in AP setting: Yes  and No Tdap vaccine given in AP setting: Yes    Contraception: vasectomy  Prenatal Labs:  Blood type/Rh A pos  Antibody screen neg  Rubella Immune  Varicella Immune  RPR NR  HBsAg Neg  HIV NR  GC neg  Chlamydia neg  Genetic screening negative  1 hour GTT 150  3 hour GTT 80, 206, 144, 48   GBS negative    Plan:  Kimberly Cruz was discharged to home in good condition.   Discharge Medications: Allergies as of 12/31/2021       Reactions   Zithromax [azithromycin] Swelling, Anaphylaxis        Medication List     TAKE these medications    acetaminophen 325 MG tablet Commonly known as: Tylenol Take 2 tablets (650 mg total) by mouth every 4 (four) hours as needed (for pain scale < 4).   benzocaine-Menthol 20-0.5 % Aero Commonly known as: DERMOPLAST Apply 1 Application topically as needed for irritation (perineal discomfort).   coconut oil Oil Apply 1 Application topically as needed.   diphenhydrAMINE 25 mg capsule Commonly known as: BENADRYL Take 1 capsule (25 mg total) by mouth every 6 (six) hours as needed for itching.   ferrous sulfate 325 (65 FE) MG tablet Take 1 tablet (325 mg total) by mouth 2 (two) times daily with a meal.   ibuprofen 600 MG tablet Commonly known as: ADVIL Take 1 tablet (600 mg total) by mouth every 6 (six) hours.   prenatal multivitamin Tabs tablet Take 1 tablet by mouth daily at 12 noon. Start taking on: January 01, 2022   senna-docusate 8.6-50 MG tablet Commonly known as: Senokot-S Take 2 tablets by mouth daily. Start taking on: January 01, 2022   witch hazel-glycerin pad Commonly known as: TUCKS Apply 1 Application topically as needed for hemorrhoids.         Follow-up Information     Linda Hedges, CNM. Schedule an appointment as soon as possible for a visit in 6 week(s).   Specialty: Certified Nurse Midwife Contact information: [place] [address] [phone]         [facility] Follow up on 01/05/2022.   Why:  BP check in office Contact information: [place] [phone]                Signed: Francetta Found, CNM 12/31/2021 8:22 PM

## 2021-12-30 NOTE — Progress Notes (Signed)
Patient is a 36 yo, G1P0, at 38 weeks 2 days. Patient presents with complaints of back pain and LOF.  Patient denies any vaginal bleeding. Patient report +FM. Monitors applied and assessing. VSS. Initial fetal heart tone 145. Lucrezia Europe CNM notified of patients arrival to unit. Plan to admit and allow labor to progress.  Kamry Faraci N Demri Poulton 12/29/21 2330

## 2021-12-30 NOTE — Progress Notes (Signed)
Labor Progress Note  Kimberly Cruz is a 36 y.o. G2P0000 at 36w2dby LMP admitted for rupture of membranes  Subjective: she is comfortable after her epidural  Objective: BP (!) 132/95   Pulse (!) 108   Temp 98.2 F (36.8 C) (Oral)   Resp 18   Ht '5\' 2"'$  (1.575 m)   Wt 85.1 kg   LMP 04/06/2021 (Exact Date)   BMI 34.31 kg/m  Notable VS details: reviewed  Fetal Assessment: FHT:  FHR: 140 bpm, variability: moderate,  accelerations:  Present,  decelerations:  Absent Category/reactivity:  Category I UC:   regular, every 6-8 minutes SVE:   per RN exam Dilation: 3cm  Effacement: 90%  Station:  -2  Consistency: soft  Position: middle  Membrane status:SROM @ 2200 Amniotic color: clear  Labs: Lab Results  Component Value Date   WBC 13.4 (H) 12/30/2021   HGB 12.7 12/30/2021   HCT 37.4 12/30/2021   MCV 86.4 12/30/2021   PLT 197 12/30/2021    Assessment / Plan: 36year old G1P0 at 342w2dith SROM  Labor:  minimal labor progress, contractions remain spaced out and cervix mostly unchanged. Will begin pitocin augmentation. Preeclampsia:   labs ordered Fetal Wellbeing:  Category I Pain Control:  Epidural I/D:   GBS negative Anticipated MOD:  NSVD  DaGertie FeyCNM 12/30/2021, 7:38 AM

## 2021-12-30 NOTE — Progress Notes (Signed)
Labor Progress Note  Kimberly Cruz is a 36 y.o. G2P0000 at 46w2dby LMP admitted for rupture of membranes  Subjective: Comfortable with epidural  Objective: BP 117/70   Pulse 88   Temp 98.4 F (36.9 C)   Resp 20   Ht '5\' 2"'$  (1.575 m)   Wt 85.1 kg   LMP 04/06/2021 (Exact Date)   SpO2 96%   BMI 34.31 kg/m   Fetal Assessment: FHT:  FHR: 140 bpm, variability: moderate,  accelerations:  Present,  decelerations:  Present occasional variable Category/reactivity:  Category I UC:   regular, every 1-3 minutes SVE:    Dilation: 3cm  Effacement: 80%  Station:  -3  Consistency: ---  Position: ---  Membrane status: SROM at 2200 Amniotic color: Clear  Labs: Lab Results  Component Value Date   WBC 13.4 (H) 12/30/2021   HGB 12.7 12/30/2021   HCT 37.4 12/30/2021   MCV 86.4 12/30/2021   PLT 197 12/30/2021    Assessment / Plan: Augmentation of labor, after SROM  Labor: Progressing normally Preeclampsia:   117/70 Fetal Wellbeing:  Category I Pain Control:  Epidural I/D:   Afebrile, GBS  neg, SROM x12 hours Anticipated MOD:  NSVD  JDuryea CNM 12/30/2021, 11:23 AM

## 2021-12-30 NOTE — Anesthesia Preprocedure Evaluation (Addendum)
Anesthesia Evaluation  Patient identified by MRN, date of birth, ID band Patient awake    Reviewed: Allergy & Precautions, NPO status , Patient's Chart, lab work & pertinent test results  History of Anesthesia Complications (+) PONV and history of anesthetic complications  Airway Mallampati: III  TM Distance: >3 FB Neck ROM: full    Dental  (+) Chipped   Pulmonary neg pulmonary ROS   Pulmonary exam normal        Cardiovascular Exercise Tolerance: Good (-) hypertensionnegative cardio ROS Normal cardiovascular exam     Neuro/Psych    GI/Hepatic negative GI ROS,,,  Endo/Other    Renal/GU   negative genitourinary   Musculoskeletal   Abdominal   Peds  Hematology negative hematology ROS (+)   Anesthesia Other Findings Past Medical History: No date: FHx: migraine headaches 03/23/2018: Gastritis No date: History of stomach ulcers No date: Hyperlipidemia No date: Tonsil, abscess  Past Surgical History: 01/17/1998: ANKLE SURGERY 06/18/2014: SEPTOPLASTY 07/17/2015: TONSILLECTOMY; N/A     Comment:  Procedure: TONSILLECTOMY;  Surgeon: Beverly Gust, MD;              Location: [facility];  Service: ENT;                Laterality: N/A;  BMI    Body Mass Index: 34.31 kg/m      Reproductive/Obstetrics (+) Pregnancy                             Anesthesia Physical Anesthesia Plan  ASA: 2  Anesthesia Plan: Epidural   Post-op Pain Management:    Induction:   PONV Risk Score and Plan:   Airway Management Planned: Natural Airway  Additional Equipment:   Intra-op Plan:   Post-operative Plan:   Informed Consent: I have reviewed the patients History and Physical, chart, labs and discussed the procedure including the risks, benefits and alternatives for the proposed anesthesia with the patient or authorized representative who has indicated his/her understanding and  acceptance.     Dental Advisory Given  Plan Discussed with: Anesthesiologist  Anesthesia Plan Comments: (Patient reports no bleeding problems and no anticoagulant use.   Patient consented for risks of anesthesia including but not limited to:  - adverse reactions to medications - risk of bleeding, infection and or nerve damage from epidural that could lead to paralysis - risk of headache or failed epidural - nerve damage due to positioning - that if epidural is used for C-section that there is a chance of epidural failure requiring spinal placement or conversion to GA - Damage to heart, brain, lungs, other parts of body or loss of life  Patient voiced understanding.)       Anesthesia Quick Evaluation

## 2021-12-30 NOTE — Progress Notes (Signed)
Labor Progress Note  Kimberly Cruz is a 36 y.o. G2P0000 at 43w2dby LMP admitted for rupture of membranes  Subjective: Reports pressure  Objective: BP 121/76   Pulse (!) 109   Temp 98.4 F (36.9 C) (Oral)   Resp 18   Ht '5\' 2"'$  (1.575 m)   Wt 85.1 kg   LMP 04/06/2021 (Exact Date)   SpO2 96%   BMI 34.31 kg/m   Fetal Assessment: FHT:  FHR: 145 bpm, variability: moderate,  accelerations:  Present,  decelerations:  Present occasional variable and earlies Category/reactivity:  Category I UC:   regular, every 2-4 minutes SVE:    Dilation: 10cm  Effacement: 100%  Station:  +3  Consistency: soft  Position: anterior  Membrane status: SROM at 2200 Amniotic color: Clear  Labs: Lab Results  Component Value Date   WBC 13.4 (H) 12/30/2021   HGB 12.7 12/30/2021   HCT 37.4 12/30/2021   MCV 86.4 12/30/2021   PLT 197 12/30/2021    Assessment / Plan: Augmentation of labor, after SROM 0608 Epidural placed 0755 Pitocin started Pitocin currently at 2109m Labor: Progressing normally Preeclampsia:   121/76 Fetal Wellbeing:  Category I Pain Control:  Epidural I/D:   Afebrile, GBS  neg Anticipated MOD:  NSVD  JeClydene LamingCNM 12/30/2021, 5:05 PM

## 2021-12-30 NOTE — Progress Notes (Signed)
Labor Progress Note  Kimberly Cruz is a 36 y.o. G2P0000 at 24w2dby LMP admitted for rupture of membranes  Subjective: Comfortable with epidural, but noting some pressure  Objective: BP 128/88   Pulse (!) 127 Comment: Pt reports feeling anxioys while talking with CNM  Temp 98.4 F (36.9 C) (Oral)   Resp 18   Ht '5\' 2"'$  (1.575 m)   Wt 85.1 kg   LMP 04/06/2021 (Exact Date)   SpO2 96%   BMI 34.31 kg/m   Fetal Assessment: FHT:  FHR: 145 bpm, variability: moderate,  accelerations:  Present,  decelerations:  Present occasional variable and earlies Category/reactivity:  Category I UC:   regular, every 2-4 minutes SVE:    Dilation: 4cm  Effacement: 90%  Station:  -2  Consistency: soft  Position: anterior  Membrane status: SROM at 2200 Amniotic color: Clear  Labs: Lab Results  Component Value Date   WBC 13.4 (H) 12/30/2021   HGB 12.7 12/30/2021   HCT 37.4 12/30/2021   MCV 86.4 12/30/2021   PLT 197 12/30/2021    Assessment / Plan: Augmentation of labor, after SROM 0608 Epidural placed 0755 Pitocin started Pitocin currently at 128m Labor: Progressing normally, will consider IUPC if no change in 2 hours Preeclampsia:   128/88 Fetal Wellbeing:  Category I Pain Control:  Epidural I/D:   Afebrile, GBS  neg, SROM x16 hours Anticipated MOD:  NSVD  JeClydene LamingCNM 12/30/2021, 2:09 PM

## 2021-12-31 LAB — COMPREHENSIVE METABOLIC PANEL
ALT: 16 U/L (ref 0–44)
AST: 28 U/L (ref 15–41)
Albumin: 2.3 g/dL — ABNORMAL LOW (ref 3.5–5.0)
Alkaline Phosphatase: 129 U/L — ABNORMAL HIGH (ref 38–126)
Anion gap: 4 — ABNORMAL LOW (ref 5–15)
BUN: 12 mg/dL (ref 6–20)
CO2: 23 mmol/L (ref 22–32)
Calcium: 8.9 mg/dL (ref 8.9–10.3)
Chloride: 110 mmol/L (ref 98–111)
Creatinine, Ser: 0.62 mg/dL (ref 0.44–1.00)
GFR, Estimated: >60 mL/min (ref >60)
Glucose, Bld: 95 mg/dL (ref 70–99)
Potassium: 3.7 mmol/L (ref 3.5–5.1)
Sodium: 137 mmol/L (ref 135–145)
Total Bilirubin: 0.7 mg/dL (ref 0.3–1.2)
Total Protein: 5 g/dL — ABNORMAL LOW (ref 6.5–8.1)

## 2021-12-31 LAB — CBC
HCT: 31.6 % — ABNORMAL LOW (ref 36.0–46.0)
Hemoglobin: 10.6 g/dL — ABNORMAL LOW (ref 12.0–15.0)
MCH: 29.5 pg (ref 26.0–34.0)
MCHC: 33.5 g/dL (ref 30.0–36.0)
MCV: 88 fL (ref 80.0–100.0)
Platelets: 191 10*3/uL (ref 150–400)
RBC: 3.59 MIL/uL — ABNORMAL LOW (ref 3.87–5.11)
RDW: 13.8 % (ref 11.5–15.5)
WBC: 19.7 10*3/uL — ABNORMAL HIGH (ref 4.0–10.5)
nRBC: 0 % (ref 0.0–0.2)

## 2021-12-31 MED ORDER — COCONUT OIL OIL: 1.0000 | TOPICAL_OIL | 0 refills | Status: DC | PRN

## 2021-12-31 MED ORDER — SENNOSIDES-DOCUSATE SODIUM 8.6-50 MG PO TABS: 2.0000 | ORAL_TABLET | Freq: Every day | ORAL | 0 refills | Status: DC

## 2021-12-31 MED ORDER — IBUPROFEN 600 MG PO TABS: 600.0000 mg | ORAL_TABLET | Freq: Four times a day (QID) | ORAL | 0 refills | Status: DC

## 2021-12-31 MED ORDER — PRENATAL MULTIVITAMIN CH: 1.0000 | ORAL_TABLET | Freq: Every day | ORAL | Status: DC

## 2021-12-31 MED ORDER — BENZOCAINE-MENTHOL 20-0.5 % EX AERO: 1.0000 | INHALATION_SPRAY | CUTANEOUS | Status: DC | PRN

## 2021-12-31 MED ORDER — ACETAMINOPHEN 325 MG PO TABS: 650.0000 mg | ORAL_TABLET | ORAL | Status: DC | PRN

## 2021-12-31 MED ORDER — WITCH HAZEL-GLYCERIN EX PADS: 1.0000 | MEDICATED_PAD | CUTANEOUS | 12 refills | Status: DC | PRN

## 2021-12-31 MED ORDER — FERROUS SULFATE 325 (65 FE) MG PO TABS: 325.0000 mg | ORAL_TABLET | Freq: Two times a day (BID) | ORAL | 0 refills | Status: DC

## 2021-12-31 MED ORDER — DIPHENHYDRAMINE HCL 25 MG PO CAPS: 25.0000 mg | ORAL_CAPSULE | Freq: Four times a day (QID) | ORAL | 0 refills | Status: DC | PRN

## 2021-12-31 NOTE — Progress Notes (Signed)
Post Partum Day 1 Subjective: Doing well, no complaints.  Tolerating regular diet, pain with PO meds, voiding and ambulating without difficulty.  No CP SOB Fever,Chills, N/V or leg pain; denies nipple or breast pain no HA change of vision, RUQ/epigastric pain  Objective: BP 129/87 (BP Location: Left Arm)   Pulse 88   Temp 98.1 F (36.7 C) (Oral)   Resp 18   Ht '5\' 2"'$  (1.575 m)   Wt 85.1 kg   LMP 04/06/2021 (Exact Date)   SpO2 98%   Breastfeeding Unknown   BMI 34.31 kg/m    Vitals:   12/30/21 1754 12/30/21 1807 12/30/21 1822 12/30/21 1837  BP: (!) 143/89 (!) 147/91 (!) 136/90 131/89   12/30/21 1852 12/30/21 1909 12/30/21 1921 12/30/21 2140  BP: 129/84 (!) 144/100 (!) 136/92 126/84   12/30/21 2245 12/31/21 0300 12/31/21 0735 12/31/21 1159  BP: (!) 125/90 128/82 111/74 129/87     Physical Exam:  General: NAD Breasts: soft/nontender CV: RRR Pulm: nl effort, CTABL Abdomen: soft, NT, BS x 4 Perineum: minimal edema, laceration repair well approximated Lochia: small Uterine Fundus: fundus firm and 1 fb below umbilicus DVT Evaluation: no cords, ttp LEs   Recent Labs    12/30/21 0049 12/31/21 0350  HGB 12.7 10.6*  HCT 37.4 31.6*  WBC 13.4* 19.7*  PLT 197 191    Assessment/Plan: 36 y.o. G2P1001 postpartum day # 1  - Continue routine PP care - Lactation consult prn - GHTN: normal labs, normotensive, last elevated BP at 2245 - Acute blood loss anemia - hemodynamically stable and asymptomatic; start po ferrous sulfate BID with stool softeners    Disposition: Does not desire Dc home today.    Francetta Found, CNM 12/31/2021  3:25 PM

## 2021-12-31 NOTE — TOC Initial Note (Signed)
Transition of Care [facility]) - Initial/Assessment Note    Patient Details  Name: Kimberly Cruz MRN: [id number] Date of Birth: [birth year]  Transition of Care [facility]) CM/SW Contact:    Shelbie Hutching, RN Phone Number: 12/31/2021, 8:51 AM  Clinical Narrative:                 RNCM received and acknowledges consult for EDPS of 9.  Consult screened out due to 9 on EDPS does not warrant a TOC consult.  MOB whom scores are greater than 9/yes to question 10 on Edinburgh Postpartum Depression Screen warrants a TOC consult.  Please consult if other concerns arise of by mother of baby request.           Patient Goals and CMS Choice        Expected Discharge Plan and Services                                                Prior Living Arrangements/Services                       Activities of Daily Living Home Assistive Devices/Equipment: None ADL Screening (condition at time of admission) Patient's cognitive ability adequate to safely complete daily activities?: Yes Is the patient deaf or have difficulty hearing?: No Does the patient have difficulty seeing, even when wearing glasses/contacts?: No Does the patient have difficulty concentrating, remembering, or making decisions?: No Patient able to express need for assistance with ADLs?: Yes Does the patient have difficulty dressing or bathing?: No Independently performs ADLs?: Yes (appropriate for developmental age) Does the patient have difficulty walking or climbing stairs?: No Weakness of Legs: None Weakness of Arms/Hands: None  Permission Sought/Granted                  Emotional Assessment              Admission diagnosis:  Amniotic fluid leaking [O42.90] Patient Active Problem List   Diagnosis Date Noted   NSVD (normal spontaneous vaginal delivery) 12/30/2021   Elevated LFTs 03/24/2021   Chronic cough 01/29/2021   Multiple thyroid nodules 12/01/2017   Fatigue 12/01/2017   Cervical high  risk HPV (human papillomavirus) test positive 02/03/2017   Preventative health care 06/28/2016   PCP:  Pleas Koch, NP Pharmacy:   [facility] [address], [place] - [place] [address] [place] [address] Phone: [phone] Fax: [phone]     Social Determinants of Health (SDOH) Interventions Food Insecurity Interventions: Patient Refused Housing Interventions: Intervention Not Indicated Transportation Interventions: Intervention Not Indicated Utilities Interventions: Intervention Not Indicated  Readmission Risk Interventions     No data to display

## 2021-12-31 NOTE — Anesthesia Postprocedure Evaluation (Signed)
Anesthesia Post Note  Patient: Kimberly Cruz  Procedure(s) Performed: AN AD HOC LABOR EPIDURAL  Patient location during evaluation: Mother Baby Anesthesia Type: Epidural Level of consciousness: awake and alert Pain management: pain level controlled Vital Signs Assessment: post-procedure vital signs reviewed and stable Respiratory status: spontaneous breathing, nonlabored ventilation and respiratory function stable Cardiovascular status: stable Postop Assessment: no headache, no backache and epidural receding Anesthetic complications: no   No notable events documented.   Last Vitals:  Vitals:   12/31/21 0300 12/31/21 0735  BP: 128/82 111/74  Pulse: 92 78  Resp: 17 18  Temp: 37.2 C 36.7 C  SpO2: 98% 98%    Last Pain:  Vitals:   12/31/21 0735  TempSrc: Oral  PainSc:                  Estill Batten

## 2021-12-31 NOTE — Progress Notes (Signed)
Discharge instructions complete and prescriptions sent to the pharmacy by the provider. Patient verbalizes understanding of teaching. Patient discharged home via wheelchair at 2020.

## 2021-12-31 NOTE — Lactation Note (Signed)
This note was copied from a baby's chart. Lactation Consultation Note  Patient Name: Kimberly Cruz Date: 12/31/2021 Reason for consult: Initial assessment;Primapara;Breastfeeding assistance;RN request;Early term 37-38.6wks;Difficult latch Age:34 hours  Maternal Data This is mom's 1st baby. Mom with history of anemia and vitamin D deficiency. Mom's plan is to breastfeed.  Today mom reports baby was having difficulty latching after the first feeding. She tried using nipple shield but per her report there was no milk in the shield and her nipples started to have some bleeding so she decided to offer baby formula supplement.as she was concerned baby was not eating. Mom pumped 1 time but reports she did not get anything so she did not pump again.  Has patient been taught Hand Expression?: Yes Does the patient have breastfeeding experience prior to this delivery?: No  Feeding Mother's Current Feeding Choice: Breast Milk and Formula Nipple Type: Slow - flow  Baby had just had a bottle feed but was still rooting. Assisted mom with positioning and latching baby in football hold. Baby did latch directly on to nipple with mom sandwiching her breast. A few swallows noted and baby became sleepy. Mom reports she is more comfortable with this position. Provided tips and strategies to maximize position and latch techniques.  LATCH Score Latch: Repeated attempts needed to sustain latch, nipple held in mouth throughout feeding, stimulation needed to elicit sucking reflex.  Audible Swallowing: A few with stimulation  Type of Nipple: Flat  Comfort (Breast/Nipple): Filling, red/small blisters or bruises, mild/mod discomfort (Right nipple readily everts with hand expression. Mom able to sandwich breast well. Left breast does sandwich well for baby to latch. Mom attempting with nipple shield however she said she was having some bleeding of her nipples. Scab on right nipple.)  Hold (Positioning):  Assistance needed to correctly position infant at breast and maintain latch.  LATCH Score: 5   Interventions Interventions: Breast feeding basics reviewed;Assisted with latch;Breast massage;Hand express;Breast compression;Adjust position;Support pillows;Position options;Ice;Education (lanolin.) Recommended mom offer baby to breast with cues and if baby does not latch mom to pump and offer any expressed breastmilk and or the formula supplemented as needed. If mom does use the nipple shield recommended mom wet the shield with her expressed colostrum to decrease friction of the shield on the nipple however baby was able to directly latch to her nipple. Recommended mom continue to use lanolin after each breastfeeding attempt.  Discharge Discharge Education: Engorgement and breast care;Warning signs for feeding baby;Outpatient recommendation Pump: Personal (Mom cozies, and haaka)  Update provided to care nurse.  Consult Status Consult Status: Follow-up Date: 12/31/21 Follow-up type: In-patient    Kimberly Cruz 12/31/2021, 12:55 PM

## 2022-03-31 ENCOUNTER — Telehealth: Payer: Managed Care, Other (non HMO) | Admitting: Physician Assistant

## 2022-03-31 DIAGNOSIS — B9689 Other specified bacterial agents as the cause of diseases classified elsewhere: Secondary | ICD-10-CM | POA: Diagnosis not present

## 2022-03-31 DIAGNOSIS — J208 Acute bronchitis due to other specified organisms: Secondary | ICD-10-CM | POA: Diagnosis not present

## 2022-03-31 MED ORDER — DOXYCYCLINE HYCLATE 100 MG PO TABS: 100.0000 mg | ORAL_TABLET | Freq: Two times a day (BID) | ORAL | 0 refills | Status: DC

## 2022-03-31 MED ORDER — ALBUTEROL SULFATE HFA 108 (90 BASE) MCG/ACT IN AERS: 2.0000 | INHALATION_SPRAY | Freq: Four times a day (QID) | RESPIRATORY_TRACT | 0 refills | Status: DC | PRN

## 2022-03-31 NOTE — Progress Notes (Signed)
Virtual Visit Consent   Kimberly Cruz, you are scheduled for a virtual visit with a [facility] [place] provider today. Just as with appointments in the office, your consent must be obtained to participate. Your consent will be active for this visit and any virtual visit you may have with one of our providers in the next 365 days. If you have a MyChart account, a copy of this consent can be sent to you electronically.  As this is a virtual visit, video technology does not allow for your provider to perform a traditional examination. This may limit your provider's ability to fully assess your condition. If your provider identifies any concerns that need to be evaluated in person or the need to arrange testing (such as labs, EKG, etc.), we will make arrangements to do so. Although advances in technology are sophisticated, we cannot ensure that it will always work on either your end or our end. If the connection with a video visit is poor, the visit may have to be switched to a telephone visit. With either a video or telephone visit, we are not always able to ensure that we have a secure connection.  By engaging in this virtual visit, you consent to the provision of healthcare and authorize for your insurance to be billed (if applicable) for the services provided during this visit. Depending on your insurance coverage, you may receive a charge related to this service.  I need to obtain your verbal consent now. Are you willing to proceed with your visit today? Kimberly Cruz has provided verbal consent on 03/31/2022 for a virtual visit (video or telephone). Leeanne Rio, [place]  Date: 03/31/2022 8:15 AM  Virtual Visit via Video Note   I, Leeanne Rio, connected with  Kimberly Cruz  ([id number], 1985-04-22) on 03/31/22 at  8:00 AM EDT by a video-enabled telemedicine application and verified that I am speaking with the correct person using two identifiers.  Location: Patient: Virtual Visit Location  Patient: Home Provider: Virtual Visit Location Provider: Home Office   I discussed the limitations of evaluation and management by telemedicine and the availability of in person appointments. The patient expressed understanding and agreed to proceed.    History of Present Illness: Kimberly Cruz is a 37 y.o. who identifies as a female who was assigned female at birth, and is being seen today for ongoing URI symptoms for the past 3+ weeks. Started with sore throat lasting about 3-4 days which resolved. Then started with nasal and chest congestion that had continued until last week and the progressively worsening since that time. Now with productive cough of colored phlegm and occasional chest tightness. Did an outside telehealth visit about a week ago and was started on Flonase. Symptoms continued so did another visit with them 2 days ago at which time he was given Tessalon and a 3-day burst of prednisone which she is taking as directed. Notes symptoms continued and really feeling poorly over past 24 hour with feeling feverish and chills. Change in sputum. No fever when temperature taken.   HPI: HPI  Problems:  Patient Active Problem List   Diagnosis Date Noted   NSVD (normal spontaneous vaginal delivery) 12/30/2021   Elevated LFTs 03/24/2021   Chronic cough 01/29/2021   Multiple thyroid nodules 12/01/2017   Fatigue 12/01/2017   Cervical high risk HPV (human papillomavirus) test positive 02/03/2017   Preventative health care 06/28/2016    Allergies:  Allergies  Allergen Reactions   Zithromax [Azithromycin] Swelling and Anaphylaxis  Medications:  Current Outpatient Medications:    albuterol (VENTOLIN HFA) 108 (90 Base) MCG/ACT inhaler, Inhale 2 puffs into the lungs every 6 (six) hours as needed for wheezing or shortness of breath., Disp: 8 g, Rfl: 0   doxycycline (VIBRA-TABS) 100 MG tablet, Take 1 tablet (100 mg total) by mouth 2 (two) times daily., Disp: 14 tablet, Rfl: 0   predniSONE  (DELTASONE) 20 MG tablet, Take 20 mg by mouth daily., Disp: , Rfl:   Observations/Objective: Patient is well-developed, well-nourished in no acute distress.  Resting comfortably at home.  Head is normocephalic, atraumatic.  No labored breathing. Speech is clear and coherent with logical content.  Patient is alert and oriented at baseline.   Assessment and Plan: 1. Acute bacterial bronchitis - albuterol (VENTOLIN HFA) 108 (90 Base) MCG/ACT inhaler; Inhale 2 puffs into the lungs every 6 (six) hours as needed for wheezing or shortness of breath.  Dispense: 8 g; Refill: 0 - doxycycline (VIBRA-TABS) 100 MG tablet; Take 1 tablet (100 mg total) by mouth 2 (two) times daily.  Dispense: 14 tablet; Refill: 0  No concerns for pregnancy. Not breastfeeding. Giving her allergies, will Rx Doxycycline.  Increase fluids.  Rest.  Saline nasal spray.  Probiotic.  Mucinex as directed.  Humidifier in bedroom. Ok to continue her Tessalon and finish prednisone. Albuterol per orders. Needs in-person evaluation if symptoms not resolving.    Follow Up Instructions: I discussed the assessment and treatment plan with the patient. The patient was provided an opportunity to ask questions and all were answered. The patient agreed with the plan and demonstrated an understanding of the instructions.  A copy of instructions were sent to the patient via MyChart unless otherwise noted below.   The patient was advised to call back or seek an in-person evaluation if the symptoms worsen or if the condition fails to improve as anticipated.  Time:  I spent 10 minutes with the patient via telehealth technology discussing the above problems/concerns.    Leeanne Rio, PA-C

## 2022-03-31 NOTE — Patient Instructions (Signed)
Delbert Harness, thank you for joining Leeanne Rio, PA-C for today's virtual visit.  While this provider is not your primary care provider (PCP), if your PCP is located in our provider database this encounter information will be shared with them immediately following your visit.   [facility] account gives you access to today's visit and all your visits, tests, and labs performed at [facility] " click here if you don't have a [facility] account or go to [email or web address]  Consent: (Patient) Kimberly Cruz provided verbal consent for this virtual visit at the beginning of the encounter.  Current Medications:  Current Outpatient Medications:    acetaminophen (TYLENOL) 325 MG tablet, Take 2 tablets (650 mg total) by mouth every 4 (four) hours as needed (for pain scale < 4)., Disp: , Rfl:    benzocaine-Menthol (DERMOPLAST) 20-0.5 % AERO, Apply 1 Application topically as needed for irritation (perineal discomfort)., Disp: , Rfl:    coconut oil OIL, Apply 1 Application topically as needed., Disp: , Rfl: 0   diphenhydrAMINE (BENADRYL) 25 mg capsule, Take 1 capsule (25 mg total) by mouth every 6 (six) hours as needed for itching., Disp: 30 capsule, Rfl: 0   ferrous sulfate 325 (65 FE) MG tablet, Take 1 tablet (325 mg total) by mouth 2 (two) times daily with a meal., Disp: 60 tablet, Rfl: 0   ibuprofen (ADVIL) 600 MG tablet, Take 1 tablet (600 mg total) by mouth every 6 (six) hours., Disp: 30 tablet, Rfl: 0   Prenatal Vit-Fe Fumarate-FA (PRENATAL MULTIVITAMIN) TABS tablet, Take 1 tablet by mouth daily at 12 noon., Disp: , Rfl:    senna-docusate (SENOKOT-S) 8.6-50 MG tablet, Take 2 tablets by mouth daily., Disp: 60 tablet, Rfl: 0   witch hazel-glycerin (TUCKS) pad, Apply 1 Application topically as needed for hemorrhoids., Disp: 40 each, Rfl: 12   Medications ordered in this encounter:  No orders of the defined types were placed in this encounter.    *If  you need refills on other medications prior to your next appointment, please contact your pharmacy*  Follow-Up: Call back or seek an in-person evaluation if the symptoms worsen or if the condition fails to improve as anticipated.  [place] [phone]  Other Instructions Take antibiotic (Doxycycline) as directed.  Increase fluids.  Get plenty of rest. Use Mucinex for congestion. Continue your prednisone and cough medications. Use the albuterol as directed, when needed. Take a daily probiotic (I recommend Align or Culturelle, but even Activia Yogurt may be beneficial).  A humidifier placed in the bedroom may offer some relief for a dry, scratchy throat of nasal irritation.  Read information below on acute bronchitis. Please call or return to clinic if symptoms are not improving.  Acute Bronchitis Bronchitis is when the airways that extend from the windpipe into the lungs get red, puffy, and painful (inflamed). Bronchitis often causes thick spit (mucus) to develop. This leads to a cough. A cough is the most common symptom of bronchitis. In acute bronchitis, the condition usually begins suddenly and goes away over time (usually in 2 weeks). Smoking, allergies, and asthma can make bronchitis worse. Repeated episodes of bronchitis may cause more lung problems.  HOME CARE Rest. Drink enough fluids to keep your pee (urine) clear or pale yellow (unless you need to limit fluids as told by your doctor). Only take over-the-counter or prescription medicines as told by your doctor. Avoid smoking and secondhand smoke. These can make bronchitis worse. If you  are a smoker, think about using nicotine gum or skin patches. Quitting smoking will help your lungs heal faster. Reduce the chance of getting bronchitis again by: Washing your hands often. Avoiding people with cold symptoms. Trying not to touch your hands to your mouth, nose, or eyes. Follow up with your doctor as told.  GET HELP  IF: Your symptoms do not improve after 1 week of treatment. Symptoms include: Cough. Fever. Coughing up thick spit. Body aches. Chest congestion. Chills. Shortness of breath. Sore throat.  GET HELP RIGHT AWAY IF:  You have an increased fever. You have chills. You have severe shortness of breath. You have bloody thick spit (sputum). You throw up (vomit) often. You lose too much body fluid (dehydration). You have a severe headache. You faint.  MAKE SURE YOU:  Understand these instructions. Will watch your condition. Will get help right away if you are not doing well or get worse. Document Released: 06/22/2007 Document Revised: 09/05/2012 Document Reviewed: 06/26/2012 [facility] Patient Information 2015 [place], [place]. This information is not intended to replace advice given to you by your health care provider. Make sure you discuss any questions you have with your health care provider.    If you have been instructed to have an in-person evaluation today at a local Urgent Care facility, please use the link below. It will take you to a list of all of our available [facility], including address, phone number and hours of operation. Please do not delay care.  [facility]  If you or a family member do not have a primary care provider, use the link below to schedule a visit and establish care. When you choose a [facility] primary care physician or advanced practice provider, you gain a long-term partner in health. Find a Primary Care Provider  Learn more about [facility]'s in-office and virtual care options: [facility]

## 2022-06-23 ENCOUNTER — Emergency Department: Payer: Managed Care, Other (non HMO)

## 2022-06-23 ENCOUNTER — Encounter: Payer: Self-pay | Admitting: Emergency Medicine

## 2022-06-23 ENCOUNTER — Emergency Department
Admission: EM | Admit: 2022-06-23 | Discharge: 2022-06-23 | Disposition: A | Discharge status: Home / Self Care | Payer: Managed Care, Other (non HMO) | Attending: Emergency Medicine | Admitting: Emergency Medicine

## 2022-06-23 ENCOUNTER — Other Ambulatory Visit: Payer: Self-pay

## 2022-06-23 DIAGNOSIS — R0602 Shortness of breath: Secondary | ICD-10-CM | POA: Insufficient documentation

## 2022-06-23 DIAGNOSIS — R079 Chest pain, unspecified: Secondary | ICD-10-CM

## 2022-06-23 DIAGNOSIS — R0789 Other chest pain: Secondary | ICD-10-CM | POA: Diagnosis not present

## 2022-06-23 LAB — TROPONIN I (HIGH SENSITIVITY)
Troponin I (High Sensitivity): 2 ng/L (ref <18)
Troponin I (High Sensitivity): 2 ng/L (ref <18)

## 2022-06-23 LAB — CBC
HCT: 40.7 % (ref 36.0–46.0)
Hemoglobin: 13.5 g/dL (ref 12.0–15.0)
MCH: 28.8 pg (ref 26.0–34.0)
MCHC: 33.2 g/dL (ref 30.0–36.0)
MCV: 86.8 fL (ref 80.0–100.0)
Platelets: 334 10*3/uL (ref 150–400)
RBC: 4.69 MIL/uL (ref 3.87–5.11)
RDW: 12.4 % (ref 11.5–15.5)
WBC: 8.7 10*3/uL (ref 4.0–10.5)
nRBC: 0 % (ref 0.0–0.2)

## 2022-06-23 LAB — BASIC METABOLIC PANEL
Anion gap: 9 (ref 5–15)
BUN: 13 mg/dL (ref 6–20)
CO2: 23 mmol/L (ref 22–32)
Calcium: 9.2 mg/dL (ref 8.9–10.3)
Chloride: 105 mmol/L (ref 98–111)
Creatinine, Ser: 0.64 mg/dL (ref 0.44–1.00)
GFR, Estimated: >60 mL/min (ref >60)
Glucose, Bld: 93 mg/dL (ref 70–99)
Potassium: 4.1 mmol/L (ref 3.5–5.1)
Sodium: 137 mmol/L (ref 135–145)

## 2022-06-23 LAB — POC URINE PREG, ED: Preg Test, Ur: NEGATIVE

## 2022-06-23 MED ORDER — KETOROLAC TROMETHAMINE 15 MG/ML IJ SOLN
15.0000 mg | Freq: Once | INTRAMUSCULAR | Status: AC
  Administered 2022-06-23: 15 mg via INTRAVENOUS
  Filled 2022-06-23: qty 1

## 2022-06-23 MED ORDER — SODIUM CHLORIDE 0.9 % IV BOLUS
1000.0000 mL | Freq: Once | INTRAVENOUS | Status: AC
  Administered 2022-06-23: 1000 mL via INTRAVENOUS

## 2022-06-23 MED ORDER — IOHEXOL 350 MG/ML SOLN
75.0000 mL | Freq: Once | INTRAVENOUS | Status: AC | PRN
  Administered 2022-06-23: 75 mL via INTRAVENOUS

## 2022-06-23 NOTE — ED Notes (Signed)
Assumed care of pt. Pt in CT at this time.

## 2022-06-23 NOTE — ED Provider Notes (Signed)
[facility] Provider Note    Event Date/Time   First MD Initiated Contact with Patient 06/23/22 0932     (approximate)   History   Chief Complaint: Chest Pain   HPI  Kimberly Cruz is a 37 y.o. female with a past history of gastritis who comes ED complaining of left-sided chest pain under the left breast that started yesterday, initially intermittent but then became constant throughout the evening.  Associate with some shortness of breath.  Worse with deep breathing.  No cough.  No fever.  No lower extremity swelling or pain.  Denies any history of DVT or PE.  She does take oral contraceptives since the birth of her son 6 months ago.  Pain is also worsened when picking up and carrying her son.     Physical Exam   Triage Vital Signs: ED Triage Vitals  Enc Vitals Group     BP 06/23/22 0830 (!) 140/93     Pulse Rate 06/23/22 0830 96     Resp 06/23/22 0830 18     Temp 06/23/22 0830 97.6 F (36.4 C)     Temp Source 06/23/22 0830 Oral     SpO2 06/23/22 0830 98 %     Weight 06/23/22 0829 167 lb (75.8 kg)     Height 06/23/22 0829 5\' 2"  (1.575 m)     Head Circumference --      Peak Flow --      Pain Score 06/23/22 0829 6     Pain Loc --      Pain Edu? --      Excl. in GC? --     Most recent vital signs: Vitals:   06/23/22 0830  BP: (!) 140/93  Pulse: 96  Resp: 18  Temp: 97.6 F (36.4 C)  SpO2: 98%    General: Awake, no distress.  CV:  Good peripheral perfusion.  Regular rate and rhythm Resp:  Normal effort.  Clear to auscultation bilaterally Abd:  No distention.  Soft nontender Other:  No lower extremity edema or calf tenderness.  Chest wall nontender   ED Results / Procedures / Treatments   Labs (all labs ordered are listed, but only abnormal results are displayed) Labs Reviewed  BASIC METABOLIC PANEL  CBC  POC URINE PREG, ED  TROPONIN I (HIGH SENSITIVITY)  TROPONIN I (HIGH SENSITIVITY)     EKG Interpreted by me Normal sinus  rhythm rate of 79.  Normal axis intervals QRS ST segments and T waves   RADIOLOGY Chest x-ray interpreted by me, negative for pneumonia or pneumothorax.  Radiology report reviewed.  CT angiogram of the chest negative.   PROCEDURES:  Procedures   MEDICATIONS ORDERED IN ED: Medications  sodium chloride 0.9 % bolus 1,000 mL (1,000 mLs Intravenous New Bag/Given 06/23/22 1033)  ketorolac (TORADOL) 15 MG/ML injection 15 mg (15 mg Intravenous Given 06/23/22 1030)  iohexol (OMNIPAQUE) 350 MG/ML injection 75 mL (75 mLs Intravenous Contrast Given 06/23/22 1110)     IMPRESSION / MDM / ASSESSMENT AND PLAN / ED COURSE  I reviewed the triage vital signs and the nursing notes.  DDx: Chest wall strain, pulmonary embolism, esophageal spasm, pneumothorax, pneumonia  Patient's presentation is most consistent with acute presentation with potential threat to life or bodily function.  Patient presents with nonspecific chest pain, pleuritic and sharp.  Vital signs unremarkable.  Does have some risk factors for PE.  CT scan obtained which is negative.  Stable for discharge, heat therapy NSAIDs.  Patient informed of incidental finding of a small pulmonary nodule and follow-up recommendation.       FINAL CLINICAL IMPRESSION(S) / ED DIAGNOSES   Final diagnoses:  Nonspecific chest pain     Rx / DC Orders   ED Discharge Orders     None        Note:  This document was prepared using Dragon voice recognition software and may include unintentional dictation errors.   Sharman Cheek, MD 06/23/22 1212

## 2022-06-23 NOTE — ED Triage Notes (Signed)
Patient c/o left sided pain under breast yesterday and throughout the day felt like she couldn't catch her breath. Pain worsened throughout the day and now feeling short of breath, pain to left shoulder blade and painful to lay on back.

## 2022-06-23 NOTE — Discharge Instructions (Addendum)
Your tests today were all okay.  Your CT scan shows a small nodule, and radiology recommends a repeat CT scan in 6-12 months to monitor this finding.  Your primary care doctor can help coordinate this for you.  Use a heating pad on the painful area and take NSAIDS such as ibuprofen and tylenol to help with the pain, which should resolve on its own.  CT summary: IMPRESSION:  1. Negative for acute pulmonary embolism.  2. There is a 0.8 cm perifissural nodule versus focal atelectasis in  the right middle lobe. Non-contrast chest CT at 6-12 months is  recommended. If the nodule is stable at time of repeat CT, then  future CT at 18-24 months (from today's scan) is considered optional  for low-risk patients, but is recommended for high-risk patients.

## 2022-06-24 ENCOUNTER — Other Ambulatory Visit: Payer: Self-pay

## 2022-06-24 ENCOUNTER — Emergency Department
Admission: EM | Admit: 2022-06-24 | Discharge: 2022-06-24 | Disposition: A | Discharge status: Home / Self Care | Payer: Managed Care, Other (non HMO) | Attending: Emergency Medicine | Admitting: Emergency Medicine

## 2022-06-24 ENCOUNTER — Emergency Department (HOSPITAL_COMMUNITY)
Admission: EM | Admit: 2022-06-24 | Discharge: 2022-06-24 | Discharge status: Against Medical Advice | Payer: Managed Care, Other (non HMO) | Attending: Emergency Medicine | Admitting: Emergency Medicine

## 2022-06-24 ENCOUNTER — Emergency Department: Payer: Managed Care, Other (non HMO)

## 2022-06-24 ENCOUNTER — Ambulatory Visit: Payer: Self-pay

## 2022-06-24 ENCOUNTER — Telehealth: Payer: Self-pay

## 2022-06-24 DIAGNOSIS — R0789 Other chest pain: Secondary | ICD-10-CM | POA: Diagnosis not present

## 2022-06-24 DIAGNOSIS — Z5321 Procedure and treatment not carried out due to patient leaving prior to being seen by health care provider: Secondary | ICD-10-CM | POA: Diagnosis not present

## 2022-06-24 DIAGNOSIS — R079 Chest pain, unspecified: Secondary | ICD-10-CM | POA: Insufficient documentation

## 2022-06-24 DIAGNOSIS — Z1152 Encounter for screening for COVID-19: Secondary | ICD-10-CM | POA: Insufficient documentation

## 2022-06-24 LAB — HEPATIC FUNCTION PANEL
ALT: 18 U/L (ref 0–44)
AST: 16 U/L (ref 15–41)
Albumin: 4.2 g/dL (ref 3.5–5.0)
Alkaline Phosphatase: 59 U/L (ref 38–126)
Bilirubin, Direct: <0.1 mg/dL (ref 0.0–0.2)
Total Bilirubin: 0.3 mg/dL (ref 0.3–1.2)
Total Protein: 7.8 g/dL (ref 6.5–8.1)

## 2022-06-24 LAB — BASIC METABOLIC PANEL
Anion gap: 7 (ref 5–15)
BUN: 11 mg/dL (ref 6–20)
CO2: 24 mmol/L (ref 22–32)
Calcium: 9.1 mg/dL (ref 8.9–10.3)
Chloride: 108 mmol/L (ref 98–111)
Creatinine, Ser: 0.57 mg/dL (ref 0.44–1.00)
GFR, Estimated: >60 mL/min (ref >60)
Glucose, Bld: 101 mg/dL — ABNORMAL HIGH (ref 70–99)
Potassium: 4.2 mmol/L (ref 3.5–5.1)
Sodium: 139 mmol/L (ref 135–145)

## 2022-06-24 LAB — CBC
HCT: 40.7 % (ref 36.0–46.0)
Hemoglobin: 13.5 g/dL (ref 12.0–15.0)
MCH: 29 pg (ref 26.0–34.0)
MCHC: 33.2 g/dL (ref 30.0–36.0)
MCV: 87.5 fL (ref 80.0–100.0)
Platelets: 337 10*3/uL (ref 150–400)
RBC: 4.65 MIL/uL (ref 3.87–5.11)
RDW: 12.2 % (ref 11.5–15.5)
WBC: 9.4 10*3/uL (ref 4.0–10.5)
nRBC: 0 % (ref 0.0–0.2)

## 2022-06-24 LAB — POC URINE PREG, ED: Preg Test, Ur: NEGATIVE

## 2022-06-24 LAB — TROPONIN I (HIGH SENSITIVITY)
Troponin I (High Sensitivity): 3 ng/L (ref <18)
Troponin I (High Sensitivity): 3 ng/L (ref <18)

## 2022-06-24 LAB — LIPASE, BLOOD: Lipase: 30 U/L (ref 11–51)

## 2022-06-24 LAB — SARS CORONAVIRUS 2 BY RT PCR: SARS Coronavirus 2 by RT PCR: NEGATIVE

## 2022-06-24 MED ORDER — CYCLOBENZAPRINE HCL 10 MG PO TABS
5.0000 mg | ORAL_TABLET | Freq: Once | ORAL | Status: AC
  Administered 2022-06-24: 5 mg via ORAL
  Filled 2022-06-24: qty 1

## 2022-06-24 MED ORDER — LIDOCAINE 5 % EX PTCH: 1.0000 | MEDICATED_PATCH | Freq: Two times a day (BID) | CUTANEOUS | 0 refills | Status: AC

## 2022-06-24 MED ORDER — LIDOCAINE 5 % EX PTCH
1.0000 | MEDICATED_PATCH | Freq: Once | CUTANEOUS | Status: DC
  Administered 2022-06-24: 1 via TRANSDERMAL
  Filled 2022-06-24: qty 1

## 2022-06-24 MED ORDER — CYCLOBENZAPRINE HCL 5 MG PO TABS: 5.0000 mg | ORAL_TABLET | Freq: Three times a day (TID) | ORAL | 0 refills | Status: AC | PRN

## 2022-06-24 MED ORDER — LIDOCAINE 5 % EX PTCH: 1.0000 | MEDICATED_PATCH | Freq: Two times a day (BID) | CUTANEOUS | 0 refills | Status: DC

## 2022-06-24 MED ORDER — CYCLOBENZAPRINE HCL 5 MG PO TABS: 5.0000 mg | ORAL_TABLET | Freq: Three times a day (TID) | ORAL | 0 refills | Status: DC | PRN

## 2022-06-24 MED ORDER — ACETAMINOPHEN 500 MG PO TABS
1000.0000 mg | ORAL_TABLET | Freq: Once | ORAL | Status: AC
  Administered 2022-06-24: 1000 mg via ORAL
  Filled 2022-06-24: qty 2

## 2022-06-24 NOTE — Telephone Encounter (Signed)
Chief Complaint: Chest pain under left breast Symptoms: Shooting pain 8/10, felt under left arm pit, difficulty breathing with the sharp pains Frequency: Since Wednesday Pertinent Negatives: Patient denies nausea, dizziness, sweating, vomiting, other symptoms Disposition: [x] ED /[] Urgent Care (no appt availability in office) / [] Appointment(In office/virtual)/ []  [facility]/ [] Home Care/ [] Refused Recommended Disposition /[] [facility]/ []  Follow-up with PCP Additional Notes: Patient says she was in the ED yesterday for the chest pain and nothing was found. She took Ibuprofen 800 mg before bed, at 0300 and around 0645 this morning for the pain, no real relief. She says she doesn't want to go back to the ED and sit for hours and nothing is found. I advised based on her symptoms to go back to the ED, advised to call PCP office for recommendation if she's not going to the ED.   Reason for Disposition  SEVERE chest pain  Answer Assessment - Initial Assessment Questions 1. LOCATION: "Where does it hurt?"       Left side underneath breast 2. RADIATION: "Does the pain go anywhere else?" (e.g., into neck, jaw, arms, back)     Feel it under the armpit 3. ONSET: "When did the chest pain begin?" (Minutes, hours or days)      Wednesday morning only with deep breaths, all the time on Wednesday morning 4. PATTERN: "Does the pain come and go, or has it been constant since it started?"  "Does it get worse with exertion?"      Constant, takes breath away 5. DURATION: "How long does it last" (e.g., seconds, minutes, hours)     N/A 6. SEVERITY: "How bad is the pain?"  (e.g., Scale 1-10; mild, moderate, or severe)    - MILD (1-3): doesn't interfere with normal activities     - MODERATE (4-7): interferes with normal activities or awakens from sleep    - SEVERE (8-10): excruciating pain, unable to do any normal activities       8 when it's the shooting pain, achy all the time 6 7.  CARDIAC RISK FACTORS: "Do you have any history of heart problems or risk factors for heart disease?" (e.g., angina, prior heart attack; diabetes, high blood pressure, high cholesterol, smoker, or strong family history of heart disease)     High blood pressure after having baby in December 8. PULMONARY RISK FACTORS: "Do you have any history of lung disease?"  (e.g., blood clots in lung, asthma, emphysema, birth control pills)     Yes on birth control pills 9. CAUSE: "What do you think is causing the chest pain?"     I don't know 10. OTHER SYMPTOMS: "Do you have any other symptoms?" (e.g., dizziness, nausea, vomiting, sweating, fever, difficulty breathing, cough)       Difficulty breathing with the sharp shooting pain every 30 seconds  Protocols used: Chest Pain-A-AH

## 2022-06-24 NOTE — Discharge Instructions (Signed)
Your workup was reassuring we discussed repeat CT imaging but of opted to hold off today given you had reassuring CT imaging yesterday.  Your COVID test is pending to follow this up in MyChart.  I suspect this is most likely related to a muscle or something called costochondritis or inflammation of your cartilage.  You can take Tylenol 1 g every 8 hours with ibuprofen 600 every 6-8 hours with food.  Do not drive or work or take care of young children on the Flexeril as it can cause sleepiness.  Return to the ER if you develop worsening symptoms, fevers, shortness of breath or any other concerns

## 2022-06-24 NOTE — ED Notes (Signed)
Pt stated, if I have to go to the lobby Im leaving. I talked with Dr. Anitra Lauth and she stated, we did not need to order anymore dx test at this time. Pt had labs and radiology test yesterday at [facility]. Pt was crying in triage and refused to wait at all. Her boyfriend meet her in the lobby to get the car. Pt was wheeled out in a wheelchair and got in the car with her boyfriend.

## 2022-06-24 NOTE — ED Provider Notes (Signed)
[facility] Provider Note    Event Date/Time   First MD Initiated Contact with Patient 06/24/22 1219     (approximate)   History   Chest Pain   HPI  Kimberly Cruz is a 37 y.o. female who comes in with chest pain.  Patient comes in for past chest pain for the past 2 days.  She was seen yesterday for similar.  Patient reports that she has these intermittent episodes of sharp stabbing pain to her left chest wall that is worse with certain movements and pressing on it.  She states that she was seen yesterday had a reassuring workup told that it was musculoskeletal is been taking ibuprofen 800s but still having significant pain.  She reports the pain episode that got significantly worse around 10:30 AM with a little bit of associated shortness of breath.  She states that she called the nurse hotline who wanted her to be seen in the emergency room for repeat evaluation and she went to [facility] initially but given they told her there was nothing they were going to do for her she came here.  She denies any significant abdominal pain.  She denies any numbness, tingling.  She reports the pain is mostly inside her left breast.  She denies any masses felt on her breast, discharge from her breast.  She did have a recent pregnancy and delivery a few months ago but denies breast-feeding. =  Physical Exam   Triage Vital Signs: ED Triage Vitals [06/24/22 1205]  Enc Vitals Group     BP (!) 144/95     Pulse Rate 90     Resp 16     Temp 98.4 F (36.9 C)     Temp Source Oral     SpO2 95 %     Weight      Height      Head Circumference      Peak Flow      Pain Score      Pain Loc      Pain Edu?      Excl. in GC?     Most recent vital signs: Vitals:   06/24/22 1205  BP: (!) 144/95  Pulse: 90  Resp: 16  Temp: 98.4 F (36.9 C)  SpO2: 95%     General: Awake, no distress.  CV:  Good peripheral perfusion.  Resp:  Normal effort.  Clear lungs Abd:  No distention.   No tenderness in the upper abdomen. Other:  No redness or warmth noted to the left breast.  No discharge from the nipple.  She has got point tenderness that she reports is within the left breast.  No significant upper abdominal tenderness.  She has got good and equal radial pulses, DP pulses.  Sensation intact throughout. No swelling in legs.  No calf tenderness  ED Results / Procedures / Treatments   Labs (all labs ordered are listed, but only abnormal results are displayed) Labs Reviewed  BASIC METABOLIC PANEL - Abnormal; Notable for the following components:      Result Value   Glucose, Bld 101 (*)    All other components within normal limits  CBC  POC URINE PREG, ED  TROPONIN I (HIGH SENSITIVITY)     EKG  My interpretation of EKG:  EKG done at [facility] was normal sinus rate of 95 without any ST elevation or T wave inversions except for slightly in lead III, normal intervals  RADIOLOGY I have reviewed  the xray personally and interpreted and patient has some low lung volumes with trace atelectasis  PROCEDURES:  Critical Care performed: No  Procedures   MEDICATIONS ORDERED IN ED: Medications  lidocaine (LIDODERM) 5 % 1 patch (1 patch Transdermal Patch Applied 06/24/22 1323)  cyclobenzaprine (FLEXERIL) tablet 5 mg (5 mg Oral Given 06/24/22 1324)  acetaminophen (TYLENOL) tablet 1,000 mg (1,000 mg Oral Given 06/24/22 1324)     IMPRESSION / MDM / ASSESSMENT AND PLAN / ED COURSE  I reviewed the triage vital signs and the nursing notes.   Patient's presentation is most consistent with acute presentation with potential threat to life or bodily function.   Differential is ACS, musculoskeletal, costochondritis, PE seems less likely given recent negative PET scan.  Will add on LFTs, lipase to make sure not referred pain from gallstones or pancreatitis but she really does not seem to be tender in her upper abdomen.  She is not breast-feeding to suggest mastitis.  She denies any  nodules in her breast to suggest breast cancer but will have her follow-up with her OB/GYN if she continues to have discomfort in her breast and she expressed understanding.  BMP is reassuring CBC normal troponin is negative.  She had a CT scan that was negative for pulmonary embolism.  Lipase, hepatic function were normal.  She does report knowing somebody who has COVID but does not feel she has been around them recently.  We discussed COVID testing and she would like to have this done.  At this time however she denies any symptoms of pneumonia had a CT scan done yesterday without any signs of PE or pneumonia on it and her pain seems to be very reproducible on examination with point tenderness in her left breast and pain worse with movement without any obvious rash noted.  We discussed repeating CT and she is agreeable to holding off and treating this more like a costochondritis versus muscle strain.  She reports improvement with the medications that were given here in the emergency room.  We discussed the Flexeril not driving working or taking care of her small child with it and she does report having someone who can come helper take care of her child over the weekend.  She will return to the ER if develops worsening symptoms I have placed a cardiology referral as needed for patient.  At this time patient feels comfortable with discharge home     FINAL CLINICAL IMPRESSION(S) / ED DIAGNOSES   Final diagnoses:  Chest wall pain  Atypical chest pain     Rx / DC Orders   ED Discharge Orders          Ordered    lidocaine (LIDODERM) 5 %  Every 12 hours        06/24/22 1529    cyclobenzaprine (FLEXERIL) 5 MG tablet  3 times daily PRN        06/24/22 1529    Ambulatory referral to Cardiology        06/24/22 1529             Note:  This document was prepared using Dragon voice recognition software and may include unintentional dictation errors.   Concha Se, MD 06/24/22 [phone]

## 2022-06-24 NOTE — ED Triage Notes (Signed)
Pt. Stated, Ive had chest pain for 2 days. I went to hospital yesterday and they did all the test and said everything was normal. During the night I was having pain every 5 minutes, they were sharp pains. It was in my arm, underneath my arm . I called the hospital and they said to come to closest hospital. I was in a meeting today and getting sharp pains every 30 sec,

## 2022-06-24 NOTE — ED Notes (Signed)
Called lab to add on lipase and hep function  

## 2022-06-27 ENCOUNTER — Telehealth: Payer: Self-pay | Admitting: Primary Care

## 2022-06-27 ENCOUNTER — Telehealth: Payer: Self-pay

## 2022-06-27 NOTE — Transitions of Care (Post Inpatient/ED Visit) (Signed)
Unable to reach pt by phone and left v/m requesting cb [phone].     06/27/2022  Name: Kimberly Cruz MRN: [id number] DOB: 09-30-1985  Today's TOC FU Call Status: Today's TOC FU Call Status:: Unsuccessul Call (1st Attempt) Unsuccessful Call (1st Attempt) Date: 06/27/22  Attempted to reach the patient regarding the most recent Inpatient/ED visit.  Follow Up Plan: Additional outreach attempts will be made to reach the patient to complete the Transitions of Care (Post Inpatient/ED visit) call.   Signature  Lewanda Rife, LPN

## 2022-06-27 NOTE — Telephone Encounter (Signed)
I cannot use that slot.  Can we see her in one of the several openings on Thursday? If that doesn't work for her, then we can put her with another provider.

## 2022-06-27 NOTE — Telephone Encounter (Signed)
Patient called in to get a sooner appointment, could she be seen at 3:40pm on Tuesday 6.11.24?  Okay with receiving a response via mychart

## 2022-06-28 NOTE — Transitions of Care (Post Inpatient/ED Visit) (Signed)
Unable to reach pt by phone and left v/m requesting pt to cb [phone].     06/28/2022  Name: Kimberly Cruz MRN: [id number] DOB: [birth year]  Today's TOC FU Call Status: Today's TOC FU Call Status:: Unsuccessful Call (2nd Attempt) Unsuccessful Call (1st Attempt) Date: 06/27/22 Unsuccessful Call (2nd Attempt) Date: 06/28/22  Attempted to reach the patient regarding the most recent Inpatient/ED visit.  Follow Up Plan: Additional outreach attempts will be made to reach the patient to complete the Transitions of Care (Post Inpatient/ED visit) call.   Signature  Lewanda Rife, LPN

## 2022-06-28 NOTE — Telephone Encounter (Signed)
Lvmtcb, sent mychart message  

## 2022-06-29 NOTE — Telephone Encounter (Signed)
Noted, will evaluate as scheduled.  

## 2022-06-29 NOTE — Transitions of Care (Post Inpatient/ED Visit) (Signed)
Unable to reach pt by phone and this is third attempt to reach pt. Pt already has appt scheduled on 06/30/22 to see Allayne Gitelman NP. Sending note to Allayne Gitelman NP.       06/29/2022  Name: Kimberly Cruz MRN: [id number] DOB: [birth year]  Today's TOC FU Call Status: Today's TOC FU Call Status:: Unsuccessful Call (3rd Attempt) Unsuccessful Call (1st Attempt) Date: 06/27/22 Unsuccessful Call (2nd Attempt) Date: 06/28/22 Unsuccessful Call (3rd Attempt) Date: 06/29/22  Attempted to reach the patient regarding the most recent Inpatient/ED visit.  Follow Up Plan: No further outreach attempts will be made at this time. We have been unable to contact the patient.  Signature  Lewanda Rife, LPN

## 2022-06-30 ENCOUNTER — Ambulatory Visit: Payer: Managed Care, Other (non HMO) | Admitting: Primary Care

## 2022-07-04 NOTE — Telephone Encounter (Signed)
error 

## 2022-10-02 ENCOUNTER — Other Ambulatory Visit: Payer: Self-pay

## 2022-10-02 ENCOUNTER — Ambulatory Visit
Admission: RE | Admit: 2022-10-02 | Discharge: 2022-10-02 | Disposition: A | Discharge status: Home / Self Care | Payer: Managed Care, Other (non HMO) | Source: Ambulatory Visit

## 2022-10-02 ENCOUNTER — Ambulatory Visit (INDEPENDENT_AMBULATORY_CARE_PROVIDER_SITE_OTHER): Payer: Managed Care, Other (non HMO)

## 2022-10-02 VITALS — BP 145/92 | HR 83 | Temp 98.6°F | Resp 16 | Ht 62.0 in | Wt 165.0 lb

## 2022-10-02 DIAGNOSIS — R051 Acute cough: Secondary | ICD-10-CM | POA: Diagnosis not present

## 2022-10-02 DIAGNOSIS — J209 Acute bronchitis, unspecified: Secondary | ICD-10-CM

## 2022-10-02 MED ORDER — PREDNISONE 20 MG PO TABS: 40.0000 mg | ORAL_TABLET | Freq: Every day | ORAL | 0 refills | Status: AC

## 2022-10-02 NOTE — ED Triage Notes (Signed)
Started with cold about a month ago but has had cough for last 2 weeks that is persistent.  Pt reports sputum is a greenish color now but was initially white/yellow.  Pt has been given cough medicine and inhaler from pcp but hasn't improved.  Pt having left ear pain today as well.

## 2022-10-02 NOTE — Discharge Instructions (Addendum)
Your xray was negative for pneumonia, you most likely have a viral illness(bronchitis, URI). Take prednisone as directed. Please follow up with PCP. Most likely you have a viral illness: no antibiotic as indicated at this time, May treat with OTC meds of choice. Make sure to drink plenty of fluids to stay hydrated(gatorade, water, popsicles,jello,etc), avoid caffeine products. Follow up with PCP. Return as needed.  Go to Er for worsening issues or concerns.

## 2022-10-02 NOTE — ED Provider Notes (Signed)
[facility]    CSN: [id number] Arrival date & time: 10/02/22  0900      History   Chief Complaint Chief Complaint  Patient presents with   Cough    Had a cold about a month ago, have had lingering cough for two weeks. - Entered by patient    HPI Kimberly Cruz is a 37 y.o. female.   37 year old female, Kimberly Cruz thinks, presents to urgent care for evaluation of cold that she has had for about a month and a persistent cough for the last 2 weeks.  Was prescribed inhaler and cough medicine but has not improved now states that her sputum is greenish in color.  The history is provided by the patient. No language interpreter was used.    Past Medical History:  Diagnosis Date   FHx: migraine headaches    Gastritis 03/23/2018   History of stomach ulcers    Hyperlipidemia    Tonsil, abscess     Patient Active Problem List   Diagnosis Date Noted   Acute bronchitis 10/02/2022   NSVD (normal spontaneous vaginal delivery) 12/30/2021   Elevated LFTs 03/24/2021   Acute cough 01/29/2021   Multiple thyroid nodules 12/01/2017   Fatigue 12/01/2017   Cervical high risk HPV (human papillomavirus) test positive 02/03/2017   Preventative health care 06/28/2016    Past Surgical History:  Procedure Laterality Date   ANKLE SURGERY  01/17/1998   SEPTOPLASTY  06/18/2014   TONSILLECTOMY N/A 07/17/2015   Procedure: TONSILLECTOMY;  Surgeon: Linus Salmons, MD;  Location[facility];  Service: ENT;  Laterality: N/A;    OB History     Gravida  2   Para  1   Term  1   Preterm  0   AB  0   Living  1      SAB  0   IAB  0   Ectopic  0   Multiple  0   Live Births  1            Home Medications    Prior to Admission medications   Medication Sig Start Date End Date Taking? Authorizing Provider  benzonatate (TESSALON) 100 MG capsule Take by mouth.   Yes [provider]  albuterol (VENTOLIN HFA) 108 (90 Base) MCG/ACT inhaler Inhale 2 puffs  into the lungs every 6 (six) hours as needed for wheezing or shortness of breath. 03/31/22   Waldon Merl, PA-C  doxycycline (VIBRA-TABS) 100 MG tablet Take 1 tablet (100 mg total) by mouth 2 (two) times daily. 03/31/22   Waldon Merl, PA-C  predniSONE (DELTASONE) 20 MG tablet Take 2 tablets (40 mg total) by mouth daily for 5 days. 10/02/22 10/07/22  Devesh Monforte, Para March, NP    Family History Family History  Problem Relation Age of Onset   Diverticulitis Father    Diabetes Maternal Grandmother    Heart disease Maternal Grandmother    Hypertension Maternal Grandmother    Diverticulitis Paternal Grandmother    Prostate cancer Maternal Grandfather     Social History Social History   Tobacco Use   Smoking status: Never   Smokeless tobacco: Never  Substance Use Topics   Alcohol use: Yes   Drug use: No     Allergies   Zithromax [azithromycin]   Review of Systems Review of Systems  Constitutional:  Negative for fever.  HENT:  Positive for congestion and ear pain.   Respiratory:  Positive for cough. Negative for shortness of breath and wheezing.  All other systems reviewed and are negative.    Physical Exam Triage Vital Signs ED Triage Vitals  Encounter Vitals Group     BP 10/02/22 0911 (!) 145/92     Systolic BP Percentile --      Diastolic BP Percentile --      Pulse Rate 10/02/22 0911 83     Resp 10/02/22 0911 16     Temp 10/02/22 0911 98.6 F (37 C)     Temp Source 10/02/22 0911 Oral     SpO2 10/02/22 0911 95 %     Weight 10/02/22 0913 165 lb (74.8 kg)     Height 10/02/22 0913 5\' 2"  (1.575 m)     Head Circumference --      Peak Flow --      Pain Score 10/02/22 0912 2     Pain Loc --      Pain Education --      Exclude from Growth Chart --    No data found.  Updated Vital Signs BP (!) 145/92   Pulse 83   Temp 98.6 F (37 C) (Oral)   Resp 16   Ht 5\' 2"  (1.575 m)   Wt 165 lb (74.8 kg)   LMP 06/29/2022 (Approximate)   SpO2 95%   BMI 30.18 kg/m    Visual Acuity Right Eye Distance:   Left Eye Distance:   Bilateral Distance:    Right Eye Near:   Left Eye Near:    Bilateral Near:     Physical Exam Vitals and nursing note reviewed.  Constitutional:      General: She is not in acute distress.    Appearance: She is well-developed.  HENT:     Head: Normocephalic.     Right Ear: Tympanic membrane is retracted.     Left Ear: Tympanic membrane is retracted.     Nose: Congestion present.     Mouth/Throat:     Lips: Pink.     Mouth: Mucous membranes are moist.     Pharynx: Oropharynx is clear.  Eyes:     General: Lids are normal.     Conjunctiva/sclera: Conjunctivae normal.     Pupils: Pupils are equal, round, and reactive to light.  Neck:     Trachea: No tracheal deviation.  Cardiovascular:     Rate and Rhythm: Normal rate and regular rhythm.     Pulses: Normal pulses.     Heart sounds: Normal heart sounds. No murmur heard. Pulmonary:     Effort: Pulmonary effort is normal.     Breath sounds: Normal breath sounds and air entry.  Abdominal:     General: Bowel sounds are normal.     Palpations: Abdomen is soft.     Tenderness: There is no abdominal tenderness.  Musculoskeletal:        General: Normal range of motion.     Cervical back: Normal range of motion.  Lymphadenopathy:     Cervical: No cervical adenopathy.  Skin:    General: Skin is warm and dry.     Findings: No rash.  Neurological:     General: No focal deficit present.     Mental Status: She is alert and oriented to person, place, and time.     GCS: GCS eye subscore is 4. GCS verbal subscore is 5. GCS motor subscore is 6.  Psychiatric:        Attention and Perception: Attention normal.        Mood and Affect: Mood  normal.        Speech: Speech normal.        Behavior: Behavior normal. Behavior is cooperative.      UC Treatments / Results  Labs (all labs ordered are listed, but only abnormal results are displayed) Labs Reviewed - No data to  display  EKG   Radiology DG Chest 2 View  Result Date: 10/02/2022 CLINICAL DATA:  Cough for 1 month.  Greenish sputum. EXAM: CHEST - 2 VIEW COMPARISON:  06/24/2022 FINDINGS: Heart size and mediastinal contours are normal. Normal lung volumes. No pleural fluid, interstitial edema or airspace disease. Mild central airway thickening. No consolidative change. IMPRESSION: Mild central airway thickening compatible with bronchitis or reactive airways disease. Electronically Signed   By: Signa Kell M.D.   On: 10/02/2022 09:41    Procedures Procedures (including critical care time)  Medications Ordered in UC Medications - No data to display  Initial Impression / Assessment and Plan / UC Course  I have reviewed the triage vital signs and the nursing notes.  Pertinent labs & imaging results that were available during my care of the patient were reviewed by me and considered in my medical decision making (see chart for details).    Discussed exam findings and plan of care with patient, strict go to ER precautions given.   Patient verbalized understanding to this provider.  Ddx: Acute cough, bronchitis, RAD, allergies, viral illness Final Clinical Impressions(s) / UC Diagnoses   Final diagnoses:  Acute cough  Acute bronchitis, unspecified organism     Discharge Instructions      Your xray was negative for pneumonia, you most likely have a viral illness(bronchitis, URI). Take prednisone as directed. Please follow up with PCP. Most likely you have a viral illness: no antibiotic as indicated at this time, May treat with OTC meds of choice. Make sure to drink plenty of fluids to stay hydrated(gatorade, water, popsicles,jello,etc), avoid caffeine products. Follow up with PCP. Return as needed.  Go to Er for worsening issues or concerns.     ED Prescriptions     Medication Sig Dispense Auth. Provider   predniSONE (DELTASONE) 20 MG tablet Take 2 tablets (40 mg total) by mouth daily for  5 days. 10 tablet Michelene Keniston, Para March, NP      PDMP not reviewed this encounter.   Clancy Gourd, NP 10/02/22 1021

## 2022-10-21 ENCOUNTER — Telehealth: Payer: Self-pay | Admitting: Primary Care

## 2022-10-21 ENCOUNTER — Ambulatory Visit (INDEPENDENT_AMBULATORY_CARE_PROVIDER_SITE_OTHER): Payer: Managed Care, Other (non HMO) | Admitting: Primary Care

## 2022-10-21 ENCOUNTER — Encounter: Payer: Self-pay | Admitting: Primary Care

## 2022-10-21 VITALS — BP 114/70 | HR 96 | Temp 98.3°F | Wt 170.0 lb

## 2022-10-21 DIAGNOSIS — R053 Chronic cough: Secondary | ICD-10-CM | POA: Insufficient documentation

## 2022-10-21 DIAGNOSIS — R911 Solitary pulmonary nodule: Secondary | ICD-10-CM | POA: Diagnosis not present

## 2022-10-21 HISTORY — DX: Chronic cough: R05.3

## 2022-10-21 MED ORDER — FLUTICASONE PROPIONATE HFA 110 MCG/ACT IN AERO
1.0000 | INHALATION_SPRAY | Freq: Two times a day (BID) | RESPIRATORY_TRACT | 0 refills | Status: DC
Start: 2022-10-21 — End: 2023-08-17

## 2022-10-21 MED ORDER — PULMICORT FLEXHALER 90 MCG/ACT IN AEPB
1.0000 | INHALATION_SPRAY | Freq: Two times a day (BID) | RESPIRATORY_TRACT | 0 refills | Status: DC
Start: 2022-10-21 — End: 2022-10-21

## 2022-10-21 MED ORDER — OMEPRAZOLE 40 MG PO CPDR
40.0000 mg | DELAYED_RELEASE_CAPSULE | Freq: Every day | ORAL | 0 refills | Status: DC
Start: 2022-10-21 — End: 2023-01-27

## 2022-10-21 NOTE — Assessment & Plan Note (Signed)
Suspect reactive airway disease.  Continue omeprazole 40 mg daily, Zyrtec 10 mg daily.  Add budesonide 90 mcg inhaler.  Inhale 1 to 2 puffs twice daily until symptoms resolved.  We discussed to rinse mouth after each use. She will update in 1 week if no improvement.  Pulmonology referral recommended at that point.  CT chest without contrast ordered and pending.

## 2022-10-21 NOTE — Addendum Note (Signed)
Addended by: Doreene Nest on: 10/21/2022 03:40 PM   Modules accepted: Orders

## 2022-10-21 NOTE — Telephone Encounter (Signed)
Noted, prescription for fluticasone/Flovent inhaler sent to pharmacy.

## 2022-10-21 NOTE — Patient Instructions (Signed)
You will be contacted regarding your CT chest.  Continue the omeprazole and Zyrtec.  Start budesonide (Pulmicort) inhaler.  Inhale 1 to 2 puffs twice daily until your symptoms resolved.  Rinse your mouth after each use.  Please update me in 1 week.  It was a pleasure to see you today!

## 2022-10-21 NOTE — Progress Notes (Signed)
Subjective:    Patient ID: Kimberly Cruz, female    DOB: 1985/12/26, 37 y.o.   MRN: [id number]  Cough Pertinent negatives include no chest pain, chills, fever, postnasal drip, sore throat or shortness of breath.    Kimberly Cruz is a very pleasant 37 y.o. female with a history of bronchitis, thyroid nodules who presents today to discuss cough.  She presented to urgent care in [place] on 10/02/2022 for an approximate 1 month history of cough and 2-week history of persistent cough.  During her visit she underwent chest x-ray which was negative for pneumonia but did show potential bronchitis versus reactive airway disease.  She was diagnosed with viral bronchitis and was prescribed prednisone 40 mg daily x 5 days.  She completed an e-visit through [facility] on 10/07/2022 for ongoing cough.  She was treated with a prednisone taper, Augmentin twice daily x 5 days, benzonatate capsules, and promethazine DM cough syrup.  Today she discusses a 2+ month history of ongoing, dry  cough. Her son is in daycare and is "sick all the time". Overall she feels well, Kimberly sickly, has continued to work and go about her normal day. She denies fevers, post nasal drip, rhinorrhea. Cough is worse during the morning and evening.   She began taking omeprazole 40 mg at night for the last 2 weeks without improvement. She's also been taking Zyrtec for a few weeks without improvement.   She denies a history of asthma and is a non-smoker.  She underwent CTA chest in June 2024 which revealed a 0.8 cm perifissural nodule to the right middle lobe.  Noncontrast CT chest at 6 to 12 months was recommended.   Review of Systems  Constitutional:  Negative for chills, fatigue and fever.  HENT:  Negative for congestion, postnasal drip, sinus pressure and sore throat.   Respiratory:  Positive for cough. Negative for shortness of breath.   Cardiovascular:  Negative for chest pain.         Past Medical History:  Diagnosis  Date   FHx: migraine headaches    Gastritis 03/23/2018   History of stomach ulcers    Hyperlipidemia    Tonsil, abscess     Social History   Socioeconomic History   Marital status: Significant Other    Spouse name: Kimberly Cruz   Number of children: Kimberly Cruz   Years of education: Kimberly Cruz   Highest education level: 12th grade  Occupational History   Kimberly Cruz  Tobacco Use   Smoking status: Never   Smokeless tobacco: Never  Substance and Sexual Activity   Alcohol use: Yes   Drug use: No   Sexual activity: Yes    Birth control/protection: Pill  Other Topics Concern   Kimberly Cruz  Social History Narrative   Single.   No children.   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    Enjoys hiking, camping, travel.   Social Determinants of Health   Financial Resource Strain: Low Risk  (06/26/2022)   Overall Financial Resource Strain (CARDIA)    Difficulty of Paying Living Expenses: Kimberly hard at all  Food Insecurity: No Food Insecurity (06/26/2022)   Hunger Vital Sign    Worried About Running Out of Food in the Last Year: Never true    Ran Out of Food in the Last Year: Never true  Transportation Needs: No Transportation Needs (06/26/2022)   PRAPARE - Administrator, Civil Service (Medical):  No    Lack of Transportation (Non-Medical): No  Physical Activity: Unknown (06/26/2022)   Exercise Vital Sign    Days of Exercise per Week: 0 days    Minutes of Exercise per Session: Kimberly Cruz  Stress: Stress Concern Present (06/26/2022)   [employer] of Occupational Health - Occupational Stress Questionnaire    Feeling of Stress : To some extent  Social Connections: Moderately Isolated (06/26/2022)   Social Connection and Isolation Panel [NHANES]    Frequency of Communication with Friends and Family: Once a week    Frequency of Social Gatherings with Friends and Family: Never    Attends Religious Services: 1 to 4 times per year    Active Member of [employer] or  Organizations: No    Attends Engineer, structural: Kimberly Cruz    Marital Status: Living with partner  Intimate Partner Violence: Kimberly At Risk (12/30/2021)   Humiliation, Afraid, Rape, and Kick questionnaire    Fear of Current or Ex-Partner: No    Emotionally Abused: No    Physically Abused: No    Sexually Abused: No    Past Surgical History:  Procedure Laterality Date   ANKLE SURGERY  01/17/1998   SEPTOPLASTY  06/18/2014   TONSILLECTOMY N/A 07/17/2015   Procedure: TONSILLECTOMY;  Surgeon: Linus Salmons, MD;  Location: [facility];  Service: ENT;  Laterality: N/A;    Family History  Problem Relation Age of Onset   Diverticulitis Father    Diabetes Maternal Grandmother    Heart disease Maternal Grandmother    Hypertension Maternal Grandmother    Diverticulitis Paternal Grandmother    Prostate cancer Maternal Grandfather     Allergies  Allergen Reactions   Zithromax [Azithromycin] Swelling and Anaphylaxis    Current Outpatient Medications on Cruz Prior to Visit  Medication Sig Dispense Refill   benzonatate (TESSALON) 100 MG capsule Take by mouth.     No current facility-administered medications on Cruz prior to visit.    BP 114/70 (BP Location: Left Arm, Patient Position: Sitting, Cuff Size: Large)   Pulse 96   Temp 98.3 F (36.8 C)   Wt 170 lb (77.1 kg)   LMP 06/29/2022 (Approximate) Comment: birth control  SpO2 98%   BMI 31.09 kg/m  Objective:   Physical Exam Constitutional:      Appearance: She is Kimberly ill-appearing.  HENT:     Right Ear: Tympanic membrane and ear canal normal.     Left Ear: Tympanic membrane and ear canal normal.     Nose: No mucosal edema.     Right Sinus: No maxillary sinus tenderness or frontal sinus tenderness.     Left Sinus: No maxillary sinus tenderness or frontal sinus tenderness.     Mouth/Throat:     Mouth: Mucous membranes are moist.  Eyes:     Conjunctiva/sclera: Conjunctivae normal.  Cardiovascular:      Rate and Rhythm: Normal rate and regular rhythm.  Pulmonary:     Effort: Pulmonary effort is normal.     Breath sounds: Normal breath sounds. No wheezing or rhonchi.     Comments: Dry cough noted several times during visit. Musculoskeletal:     Cervical back: Neck supple.  Skin:    General: Skin is warm and dry.           Assessment & Plan:  Persistent cough for 3 weeks or longer Assessment & Plan: Suspect reactive airway disease.  Continue omeprazole 40 mg daily, Zyrtec 10 mg daily.  Add  budesonide 90 mcg inhaler.  Inhale 1 to 2 puffs twice daily until symptoms resolved.  We discussed to rinse mouth after each use. She will update in 1 week if no improvement.  Pulmonology referral recommended at that point.  CT chest without contrast ordered and pending.  Orders: -     CT CHEST WO CONTRAST; Future -     Pulmicort Flexhaler; Inhale 1-2 puffs into the lungs 2 (two) times daily. Rinse mouth after each use.  Dispense: 1 each; Refill: 0  Pulmonary nodule Assessment & Plan: Reviewed CTA chest from June 2024.  Given persistent cough, coupled with those findings, we will proceed with repeat CT chest without contrast.  She agrees.  Orders placed.  Orders: -     CT CHEST WO CONTRAST; Future        Doreene Nest, NP

## 2022-10-21 NOTE — Assessment & Plan Note (Signed)
Reviewed CTA chest from June 2024.  Given persistent cough, coupled with those findings, we will proceed with repeat CT chest without contrast.  She agrees.  Orders placed.

## 2022-10-21 NOTE — Telephone Encounter (Signed)
Patient contacted the office regarding medication Budesonide (PULMICORT FLEXHALER) 90 MCG/ACT inhaler , states this will not be covered by her insurance, is asking if Jae Dire could send an alternative to her preferred pharmacy. Please advise, thanks!

## 2022-10-27 ENCOUNTER — Ambulatory Visit: Payer: Managed Care, Other (non HMO) | Admitting: Primary Care

## 2022-11-11 NOTE — Telephone Encounter (Signed)
Looking at patient chart looks like it was done with [facility] ?  I don't see anything as far as results.

## 2022-11-22 ENCOUNTER — Emergency Department: Payer: Managed Care, Other (non HMO)

## 2022-11-22 ENCOUNTER — Other Ambulatory Visit: Payer: Self-pay

## 2022-11-22 ENCOUNTER — Emergency Department
Admission: EM | Admit: 2022-11-22 | Discharge: 2022-11-23 | Disposition: A | Discharge status: Home / Self Care | Payer: Managed Care, Other (non HMO) | Attending: Emergency Medicine | Admitting: Emergency Medicine

## 2022-11-22 ENCOUNTER — Encounter: Payer: Self-pay | Admitting: *Deleted

## 2022-11-22 DIAGNOSIS — R8289 Other abnormal findings on cytological and histological examination of urine: Secondary | ICD-10-CM | POA: Diagnosis not present

## 2022-11-22 DIAGNOSIS — D72829 Elevated white blood cell count, unspecified: Secondary | ICD-10-CM | POA: Diagnosis not present

## 2022-11-22 DIAGNOSIS — R1033 Periumbilical pain: Secondary | ICD-10-CM | POA: Insufficient documentation

## 2022-11-22 DIAGNOSIS — R1031 Right lower quadrant pain: Secondary | ICD-10-CM | POA: Insufficient documentation

## 2022-11-22 LAB — URINALYSIS, ROUTINE W REFLEX MICROSCOPIC
Bacteria, UA: NONE SEEN
Bilirubin Urine: NEGATIVE
Glucose, UA: NEGATIVE mg/dL
Ketones, ur: NEGATIVE mg/dL
Nitrite: NEGATIVE
Protein, ur: NEGATIVE mg/dL
Specific Gravity, Urine: 1.02 (ref 1.005–1.030)
pH: 6 (ref 5.0–8.0)

## 2022-11-22 LAB — CBC
HCT: 40.4 % (ref 36.0–46.0)
Hemoglobin: 13.8 g/dL (ref 12.0–15.0)
MCH: 29.6 pg (ref 26.0–34.0)
MCHC: 34.2 g/dL (ref 30.0–36.0)
MCV: 86.5 fL (ref 80.0–100.0)
Platelets: 307 10*3/uL (ref 150–400)
RBC: 4.67 MIL/uL (ref 3.87–5.11)
RDW: 12.8 % (ref 11.5–15.5)
WBC: 11.3 10*3/uL — ABNORMAL HIGH (ref 4.0–10.5)
nRBC: 0 % (ref 0.0–0.2)

## 2022-11-22 LAB — COMPREHENSIVE METABOLIC PANEL
ALT: 22 U/L (ref 0–44)
AST: 20 U/L (ref 15–41)
Albumin: 4 g/dL (ref 3.5–5.0)
Alkaline Phosphatase: 59 U/L (ref 38–126)
Anion gap: 10 (ref 5–15)
BUN: 13 mg/dL (ref 6–20)
CO2: 23 mmol/L (ref 22–32)
Calcium: 9 mg/dL (ref 8.9–10.3)
Chloride: 102 mmol/L (ref 98–111)
Creatinine, Ser: 0.56 mg/dL (ref 0.44–1.00)
GFR, Estimated: >60 mL/min (ref >60)
Glucose, Bld: 99 mg/dL (ref 70–99)
Potassium: 3.6 mmol/L (ref 3.5–5.1)
Sodium: 135 mmol/L (ref 135–145)
Total Bilirubin: 0.7 mg/dL (ref <1.2)
Total Protein: 7.6 g/dL (ref 6.5–8.1)

## 2022-11-22 LAB — LIPASE, BLOOD: Lipase: 33 U/L (ref 11–51)

## 2022-11-22 LAB — POC URINE PREG, ED: Preg Test, Ur: NEGATIVE

## 2022-11-22 MED ORDER — IOHEXOL 300 MG/ML  SOLN
100.0000 mL | Freq: Once | INTRAMUSCULAR | Status: AC | PRN
  Administered 2022-11-22: 100 mL via INTRAVENOUS

## 2022-11-22 NOTE — ED Triage Notes (Signed)
Pt ambulatory to triage.  Pt has abd pain around the navel and right lower abd.  No v/d  no fever  pt alert  speech clear.

## 2022-11-22 NOTE — ED Notes (Signed)
Poct pregnancy Negative 

## 2022-11-22 NOTE — ED Provider Notes (Signed)
[facility] Provider Note    Event Date/Time   First MD Initiated Contact with Patient 11/22/22 2312     (approximate)   History   Abdominal Pain   HPI  Kimberly Cruz is a 37 y.o. female with history of migraines, gastritis who presents to the emergency department with pain that started around her umbilicus and moved to the right lower abdomen.  Pain started today.  No fevers, vomiting, diarrhea, dysuria, hematuria, vaginal bleeding or discharge.  No previous abdominal surgery.  No pelvic pain.   History provided by patient, significant other.    Past Medical History:  Diagnosis Date   FHx: migraine headaches    Gastritis 03/23/2018   History of stomach ulcers    Hyperlipidemia    Tonsil, abscess     Past Surgical History:  Procedure Laterality Date   ANKLE SURGERY  01/17/1998   SEPTOPLASTY  06/18/2014   TONSILLECTOMY N/A 07/17/2015   Procedure: TONSILLECTOMY;  Surgeon: Linus Salmons, MD;  Location: [facility];  Service: ENT;  Laterality: N/A;    MEDICATIONS:  Prior to Admission medications   Medication Sig Start Date End Date Taking? Authorizing Provider  benzonatate (TESSALON) 100 MG capsule Take by mouth.    [provider]  fluticasone (FLOVENT HFA) 110 MCG/ACT inhaler Inhale 1 puff into the lungs 2 (two) times daily. Rinse mouth after each use 10/21/22   Doreene Nest, NP  omeprazole (PRILOSEC) 40 MG capsule Take 1 capsule (40 mg total) by mouth daily. For cough 10/21/22   Doreene Nest, NP    Physical Exam   Triage Vital Signs: ED Triage Vitals  Encounter Vitals Group     BP 11/22/22 2003 (!) 153/105     Systolic BP Percentile --      Diastolic BP Percentile --      Pulse Rate 11/22/22 2003 100     Resp 11/22/22 2003 20     Temp 11/22/22 2003 98.1 F (36.7 C)     Temp Source 11/22/22 2003 Oral     SpO2 11/22/22 2003 98 %     Weight 11/22/22 2001 165 lb (74.8 kg)     Height 11/22/22 2001 5\' 2"   (1.575 m)     Head Circumference --      Peak Flow --      Pain Score 11/22/22 2001 6     Pain Loc --      Pain Education --      Exclude from Growth Chart --     Most recent vital signs: Vitals:   11/23/22 0030 11/23/22 0100  BP: (!) 141/97 (!) 126/99  Pulse: 81 84  Resp:    Temp:    SpO2: 100% 99%    CONSTITUTIONAL: Alert, responds appropriately to questions. Well-appearing; well-nourished HEAD: Normocephalic, atraumatic EYES: Conjunctivae clear, pupils appear equal, sclera nonicteric ENT: normal nose; moist mucous membranes NECK: Supple, normal ROM CARD: RRR; S1 and S2 appreciated RESP: Normal chest excursion without splinting or tachypnea; breath sounds clear and equal bilaterally; no wheezes, no rhonchi, no rales, no hypoxia or respiratory distress, speaking full sentences ABD/GI: Non-distended; soft, tender to palpation around the umbilicus and right lower quadrant.  No tenderness in the pelvic region.  No tenderness in the right upper quadrant. BACK: The back appears normal EXT: Normal ROM in all joints; no deformity noted, no edema SKIN: Normal color for age and race; warm; no rash on exposed skin NEURO: Moves all extremities equally, normal  speech PSYCH: The patient's mood and manner are appropriate.   ED Results / Procedures / Treatments   LABS: (all labs ordered are listed, but only abnormal results are displayed) Labs Reviewed  CBC - Abnormal; Notable for the following components:      Result Value   WBC 11.3 (*)    All other components within normal limits  URINALYSIS, ROUTINE W REFLEX MICROSCOPIC - Abnormal; Notable for the following components:   Color, Urine YELLOW (*)    APPearance HAZY (*)    Hgb urine dipstick SMALL (*)    Leukocytes,Ua MODERATE (*)    All other components within normal limits  URINE CULTURE  LIPASE, BLOOD  COMPREHENSIVE METABOLIC PANEL  POC URINE PREG, ED     EKG:   RADIOLOGY: My personal review and interpretation of  imaging: CT scan shows normal appendix.  I have personally reviewed all radiology reports.   CT ABDOMEN PELVIS W CONTRAST  Result Date: 11/23/2022 CLINICAL DATA:  Right lower quadrant abdominal pain EXAM: CT ABDOMEN AND PELVIS WITH CONTRAST TECHNIQUE: Multidetector CT imaging of the abdomen and pelvis was performed using the standard protocol following bolus administration of intravenous contrast. RADIATION DOSE REDUCTION: This exam was performed according to the departmental dose-optimization program which includes automated exposure control, adjustment of the mA and/or kV according to patient size and/or use of iterative reconstruction technique. CONTRAST:  OMNIPAQUE IOHEXOL 300 MG/ML  SOLN COMPARISON:  None Available. FINDINGS: Lower chest: No acute abnormality. Hepatobiliary: Small hypodensity within the right hepatic dome at axial image # 4/2, is not well characterized on this examination, but appears stable since prior exam with this was better demonstrated as a simple cyst. The liver is otherwise unremarkable. Gallbladder unremarkable. No intra or extrahepatic biliary ductal dilation. Pancreas: Unremarkable Spleen: Unremarkable Adrenals/Urinary Tract: Adrenal glands are unremarkable. Kidneys are normal, without renal calculi, focal lesion, or hydronephrosis. Bladder is unremarkable. Stomach/Bowel: Stomach is within normal limits. Appendix appears normal. No evidence of bowel wall thickening, distention, or inflammatory changes. Vascular/Lymphatic: No significant vascular findings are present. No enlarged abdominal or pelvic lymph nodes. Reproductive: Uterus and bilateral adnexa are unremarkable. Other: Tiny fat containing left inguinal hernia. Musculoskeletal: No acute or significant osseous findings. IMPRESSION: 1. No acute intra-abdominal pathology identified. No definite radiographic explanation for the patient's reported symptoms. Normal appendix. 2. Tiny fat containing left inguinal hernia.  Electronically Signed   By: Helyn Numbers M.D.   On: 11/23/2022 01:00     PROCEDURES:  Critical Care performed: No    Procedures    IMPRESSION / MDM / ASSESSMENT AND PLAN / ED COURSE  I reviewed the triage vital signs and the nursing notes.    Patient here with right lower quadrant abdominal pain.    DIFFERENTIAL DIAGNOSIS (includes but not limited to):   Appendicitis, colitis, UTI, kidney stone, pyelonephritis   Patient's presentation is most consistent with acute presentation with potential threat to life or bodily function.   PLAN: Labs obtained from triage show leukocytosis of 11.3.  Normal renal function, LFTs and lipase.  Urine does show white blood cells but no bacteria.  She does have many squamous cells so this could be a dirty catch.  Pregnancy test is negative.  Will obtain CT of the abdomen pelvis.  Patient declines anything for pain at this time.   MEDICATIONS GIVEN IN ED: Medications  iohexol (OMNIPAQUE) 300 MG/ML solution 100 mL (100 mLs Intravenous Contrast Given 11/22/22 2350)     ED COURSE: CT scan  reviewed and interpreted by myself and the radiologist and shows no acute abnormality.  Appendix is normal.  Discussed findings of pyuria seen on urinalysis.  Patient again denies any GU symptoms and has no pelvic tenderness.  She agrees to hold off on starting antibiotics and will send urine culture.  Discussed with patient if this is abnormal, she will be contacted to be started on antibiotics.  Recommended Tylenol, Motrin at home, bland diet and close follow-up with her PCP.  We discussed at length return precautions.   At this time, I do not feel there is any life-threatening condition present. I reviewed all nursing notes, vitals, pertinent previous records.  All lab and urine results, EKGs, imaging ordered have been independently reviewed and interpreted by myself.  I reviewed all available radiology reports from any imaging ordered this visit.  Based on my  assessment, I feel the patient is safe to be discharged home without further emergent workup and can continue workup as an outpatient as needed. Discussed all findings, treatment plan as well as usual and customary return precautions.  They verbalize understanding and are comfortable with this plan.  Outpatient follow-up has been provided as needed.  All questions have been answered.    CONSULTS:  none   OUTSIDE RECORDS REVIEWED: Reviewed last family medicine visit on 10/07/2022.       FINAL CLINICAL IMPRESSION(S) / ED DIAGNOSES   Final diagnoses:  Right lower quadrant abdominal pain     Rx / DC Orders   ED Discharge Orders     None        Note:  This document was prepared using Dragon voice recognition software and may include unintentional dictation errors.   Charna Neeb, Layla Maw, DO 11/23/22 [phone]

## 2022-11-23 NOTE — Discharge Instructions (Addendum)
You may alternate Tylenol 1000 mg every 6 hours as needed for pain, fever and Ibuprofen 800 mg every 6-8 hours as needed for pain, fever.  Please take Ibuprofen with food.  Do not take more than 4000 mg of Tylenol (acetaminophen) in a 24 hour period.  I recommend a bland diet for the next couple of days.

## 2022-11-24 LAB — URINE CULTURE

## 2022-11-28 ENCOUNTER — Encounter: Payer: Self-pay | Admitting: Primary Care

## 2022-11-28 ENCOUNTER — Ambulatory Visit (INDEPENDENT_AMBULATORY_CARE_PROVIDER_SITE_OTHER): Payer: Managed Care, Other (non HMO) | Admitting: Primary Care

## 2022-11-28 VITALS — BP 138/88 | HR 82 | Temp 97.8°F | Ht 62.0 in | Wt 174.0 lb

## 2022-11-28 DIAGNOSIS — R911 Solitary pulmonary nodule: Secondary | ICD-10-CM

## 2022-11-28 DIAGNOSIS — R5382 Chronic fatigue, unspecified: Secondary | ICD-10-CM

## 2022-11-28 DIAGNOSIS — R109 Unspecified abdominal pain: Secondary | ICD-10-CM | POA: Insufficient documentation

## 2022-11-28 DIAGNOSIS — R1033 Periumbilical pain: Secondary | ICD-10-CM | POA: Diagnosis not present

## 2022-11-28 NOTE — Assessment & Plan Note (Signed)
Differentials include metabolic cause, anxiety, overweight.  Checking labs today including thyroid studies, iron studies, vitamin D, vitamin B12. Recommended to increase personal time for herself for stress reduction.

## 2022-11-28 NOTE — Assessment & Plan Note (Signed)
Resolved.  Reviewed ED notes, labs, imaging. Continue to monitor.

## 2022-11-28 NOTE — Patient Instructions (Addendum)
Stop by the lab prior to leaving today. I will notify you of your results once received.   We we will repeat your CT chest again in April 2026.  Consider magnesium glycinate or benefiber for abdominal bloating/gas.  It was a pleasure to see you today!

## 2022-11-28 NOTE — Assessment & Plan Note (Signed)
Reviewed CT chest from October 2024 through [facility]. Repeat CT chest in April 2026.

## 2022-11-28 NOTE — Progress Notes (Signed)
Subjective:    Patient ID: Kimberly Cruz, female    DOB: 08/12/1985, 37 y.o.   MRN: [id number]  HPI  Kimberly Cruz is a very pleasant 37 y.o. female with a history of elevated LFTs, fatigue, multiple thyroid nodules who presents today for ED follow up.  She presented to [facility] ED on 11/22/22 for umbilical abdominal pain with radiation to the right lower abdomen that started earlier that day.  No fevers, vomiting, diarrhea, urinary symptoms, vaginal symptoms.  During her stay in the ED she underwent CT abdomen/pelvis with contrast which was negative for acute intra-abdominal process.  She did have a tiny fat containing left inguinal hernia.  Labs revealed mild leukocytosis with normal renal function/lipase/LFTs.  Urinalysis was negative for infection and urine pregnancy test was negative.  She was discharged home later with recommendations for PCP follow-up.  Since her ED visit she's feeling better in terms of her abdominal pain. Her symptoms have resolved as of four days ago. She mentions today that she had these same symptoms one week prior, symptoms lasted about 2 hours and resolved after laying down.   She does feel abdominal bloating constantly, also with fatigue and low energy since the birth of her son. She is post partum 11 months ago. She has noticed decreased ability to focus at work, increased anxiety and worrying.  She has a lot of responsibility at home, starts her day at 6 AM and does not get a chance to rest until 9 PM.  She's been taking oral iron chewable medication once weekly. She is sleeping well during the night. She does not exercise regularly.   She experiences bowel movements twice daily which is typical for her.  She has tried probiotics in the past which caused severe abdominal cramping and pain.  Wt Readings from Last 3 Encounters:  11/28/22 174 lb (78.9 kg)  11/22/22 165 lb (74.8 kg)  10/21/22 170 lb (77.1 kg)      Review of Systems  Constitutional:  Negative for  fever.  Gastrointestinal:  Negative for abdominal pain, constipation, nausea and vomiting.  Psychiatric/Behavioral:  The patient is nervous/anxious.          Past Medical History:  Diagnosis Date   FHx: migraine headaches    Gastritis 03/23/2018   History of stomach ulcers    Hyperlipidemia    Tonsil, abscess     Social History   Socioeconomic History   Marital status: Significant Other    Spouse name: Not on file   Number of children: Not on file   Years of education: Not on file   Highest education level: 12th grade  Occupational History   Not on file  Tobacco Use   Smoking status: Never   Smokeless tobacco: Never  Substance and Sexual Activity   Alcohol use: Yes   Drug use: No   Sexual activity: Yes    Birth control/protection: Pill  Other Topics Concern   Not on file  Social History Narrative   Single.   No children.   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    Enjoys hiking, camping, travel.   Social Determinants of Health   Financial Resource Strain: Low Risk  (06/26/2022)   Overall Financial Resource Strain (CARDIA)    Difficulty of Paying Living Expenses: Not hard at all  Food Insecurity: No Food Insecurity (06/26/2022)   Hunger Vital Sign    Worried About Running Out of Food in the Last Year: Never true  Ran Out of Food in the Last Year: Never true  Transportation Needs: No Transportation Needs (06/26/2022)   PRAPARE - Administrator, Civil Service (Medical): No    Lack of Transportation (Non-Medical): No  Physical Activity: Unknown (06/26/2022)   Exercise Vital Sign    Days of Exercise per Week: 0 days    Minutes of Exercise per Session: Not on file  Stress: Stress Concern Present (06/26/2022)   [employer] of Occupational Health - Occupational Stress Questionnaire    Feeling of Stress : To some extent  Social Connections: Moderately Isolated (06/26/2022)   Social Connection and Isolation Panel [NHANES]    Frequency of  Communication with Friends and Family: Once a week    Frequency of Social Gatherings with Friends and Family: Never    Attends Religious Services: 1 to 4 times per year    Active Member of [employer] or Organizations: No    Attends Engineer, structural: Not on file    Marital Status: Living with partner  Intimate Partner Violence: Not At Risk (12/30/2021)   Humiliation, Afraid, Rape, and Kick questionnaire    Fear of Current or Ex-Partner: No    Emotionally Abused: No    Physically Abused: No    Sexually Abused: No    Past Surgical History:  Procedure Laterality Date   ANKLE SURGERY  01/17/1998   SEPTOPLASTY  06/18/2014   TONSILLECTOMY N/A 07/17/2015   Procedure: TONSILLECTOMY;  Surgeon: Linus Salmons, MD;  Location: [facility];  Service: ENT;  Laterality: N/A;    Family History  Problem Relation Age of Onset   Diverticulitis Father    Diabetes Maternal Grandmother    Heart disease Maternal Grandmother    Hypertension Maternal Grandmother    Diverticulitis Paternal Grandmother    Prostate cancer Maternal Grandfather     Allergies  Allergen Reactions   Zithromax [Azithromycin] Swelling and Anaphylaxis    Current Outpatient Medications on File Prior to Visit  Medication Sig Dispense Refill   fluticasone (FLOVENT HFA) 110 MCG/ACT inhaler Inhale 1 puff into the lungs 2 (two) times daily. Rinse mouth after each use 1 each 0   omeprazole (PRILOSEC) 40 MG capsule Take 1 capsule (40 mg total) by mouth daily. For cough 90 capsule 0   benzonatate (TESSALON) 100 MG capsule Take by mouth. (Patient not taking: Reported on 11/28/2022)     No current facility-administered medications on file prior to visit.    BP 138/88   Pulse 82   Temp 97.8 F (36.6 C) (Temporal)   Ht 5\' 2"  (1.575 m)   Wt 174 lb (78.9 kg)   LMP  (LMP Unknown)   SpO2 98%   BMI 31.83 kg/m  Objective:   Physical Exam Cardiovascular:     Rate and Rhythm: Normal rate and regular rhythm.   Pulmonary:     Effort: Pulmonary effort is normal.     Breath sounds: Normal breath sounds.  Abdominal:     General: Bowel sounds are normal.     Palpations: Abdomen is soft.     Tenderness: There is no abdominal tenderness.  Musculoskeletal:     Cervical back: Neck supple.  Skin:    General: Skin is warm and dry.  Neurological:     Mental Status: She is alert and oriented to person, place, and time.  Psychiatric:        Mood and Affect: Mood normal.           Assessment &  Plan:  Chronic fatigue Assessment & Plan: Differentials include metabolic cause, anxiety, overweight.  Checking labs today including thyroid studies, iron studies, vitamin D, vitamin B12. Recommended to increase personal time for herself for stress reduction.  Orders: -     TSH -     Vitamin B12 -     VITAMIN D 25 Hydroxy (Vit-D Deficiency, Fractures) -     IBC + Ferritin -     T4, free  Pulmonary nodule Assessment & Plan: Reviewed CT chest from October 2024 through Care Everywhere. Repeat CT chest in April 2026.   Periumbilical abdominal pain Assessment & Plan: Resolved.  Reviewed ED notes, labs, imaging. Continue to monitor.         Doreene Nest, NP

## 2022-11-29 LAB — IBC + FERRITIN
Ferritin: 17.3 ng/mL (ref 10.0–291.0)
Iron: 46 ug/dL (ref 42–145)
Saturation Ratios: 11.1 % — ABNORMAL LOW (ref 20.0–50.0)
TIBC: 413 ug/dL (ref 250.0–450.0)
Transferrin: 295 mg/dL (ref 212.0–360.0)

## 2022-11-29 LAB — TSH: TSH: 0.69 u[IU]/mL (ref 0.35–5.50)

## 2022-11-29 LAB — VITAMIN B12: Vitamin B-12: 182 pg/mL — ABNORMAL LOW (ref 211–911)

## 2022-11-29 LAB — T4, FREE: Free T4: 0.85 ng/dL (ref 0.60–1.60)

## 2022-11-29 LAB — VITAMIN D 25 HYDROXY (VIT D DEFICIENCY, FRACTURES): VITD: 20.74 ng/mL — ABNORMAL LOW (ref 30.00–100.00)

## 2023-01-26 ENCOUNTER — Other Ambulatory Visit: Payer: Self-pay | Admitting: Primary Care

## 2023-01-26 DIAGNOSIS — R053 Chronic cough: Secondary | ICD-10-CM

## 2023-05-11 IMAGING — DX DG CHEST 2V
2 series · 2 of 2 positions shown · non-contrast
Comparison: None

CLINICAL DATA: Provided history: Chronic cough. Additional history
symptoms worsened after contracting [id number] in November 2020.

EXAM:
CHEST - 2 VIEW

[chest pa]
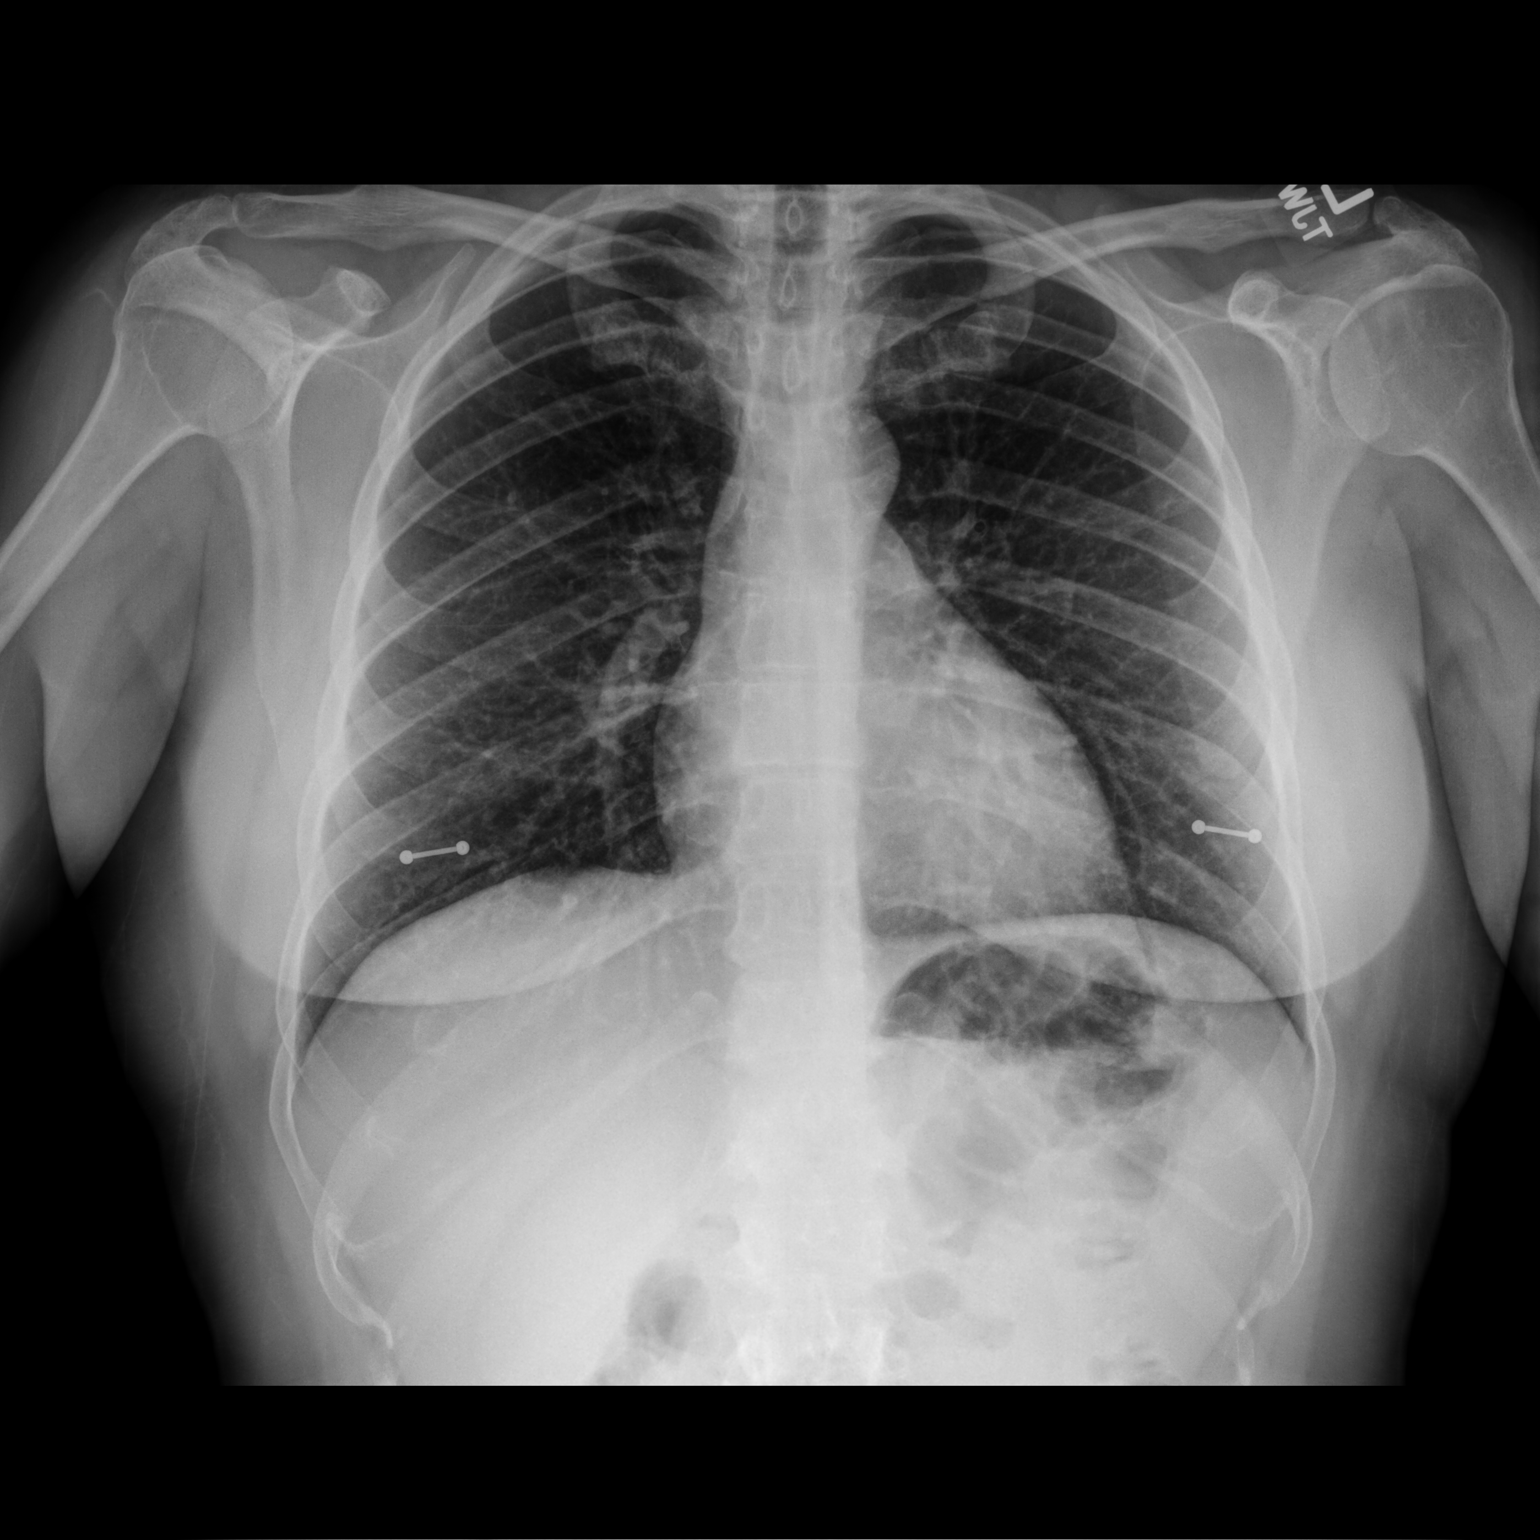

[chest lat]
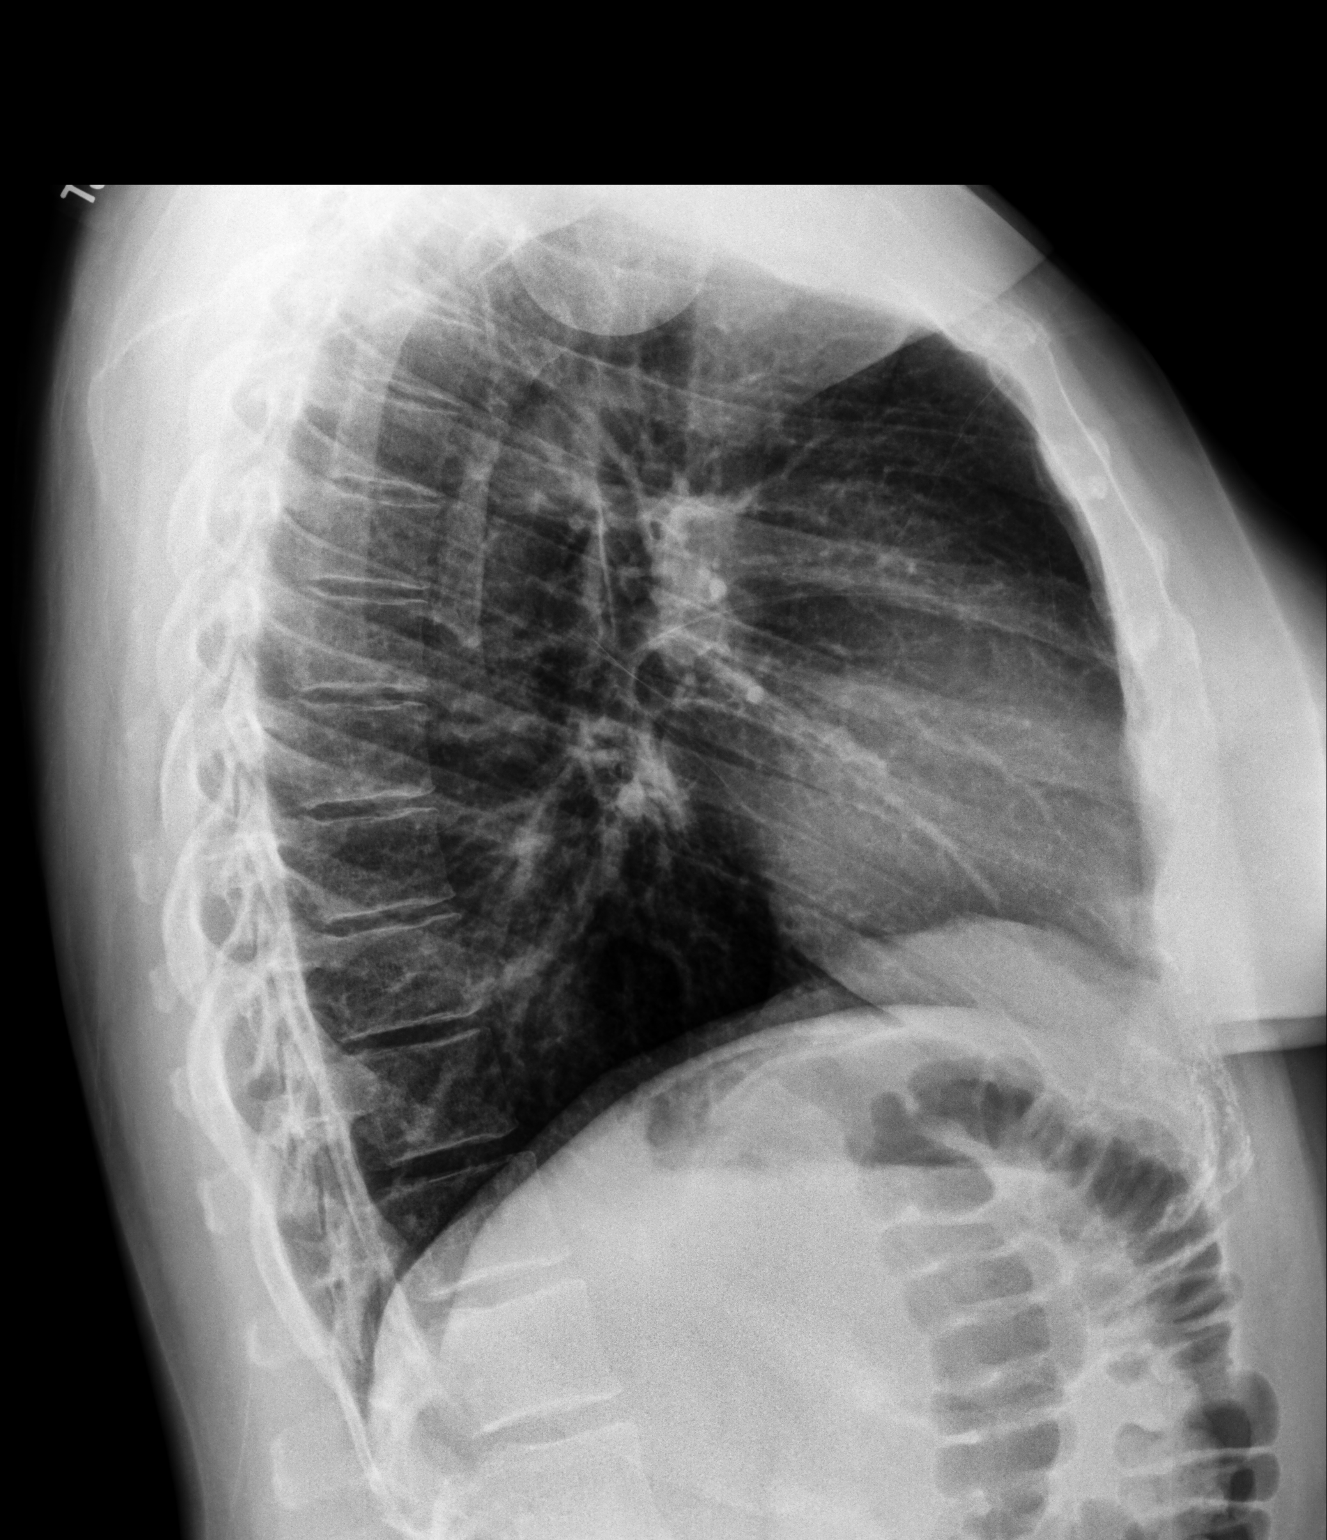

[2 of 2 positions shown; findings below may reference images not displayed]

FINDINGS: Heart size within normal limits. No appreciable airspace
consolidation. No evidence of pleural effusion or pneumothorax. No
acute bony abnormality identified.
IMPRESSION: No evidence of active cardiopulmonary disease.

## 2023-08-17 ENCOUNTER — Ambulatory Visit
Admission: RE | Admit: 2023-08-17 | Discharge: 2023-08-17 | Disposition: A | Discharge status: Home / Self Care | Source: Ambulatory Visit | Attending: Primary Care

## 2023-08-17 ENCOUNTER — Ambulatory Visit (INDEPENDENT_AMBULATORY_CARE_PROVIDER_SITE_OTHER): Admitting: Primary Care

## 2023-08-17 ENCOUNTER — Encounter: Payer: Self-pay | Admitting: Primary Care

## 2023-08-17 VITALS — BP 112/64 | HR 77 | Temp 97.2°F | Ht 62.0 in | Wt 181.0 lb

## 2023-08-17 DIAGNOSIS — N644 Mastodynia: Secondary | ICD-10-CM | POA: Insufficient documentation

## 2023-08-17 DIAGNOSIS — Z803 Family history of malignant neoplasm of breast: Secondary | ICD-10-CM | POA: Diagnosis not present

## 2023-08-17 DIAGNOSIS — M79672 Pain in left foot: Secondary | ICD-10-CM | POA: Diagnosis not present

## 2023-08-17 NOTE — Patient Instructions (Signed)
 Complete xray(s) prior to leaving today. I will notify you of your results once received.  You will receive a phone call regarding the mammogram and ultrasound.  It was a pleasure to see you today!

## 2023-08-17 NOTE — Assessment & Plan Note (Signed)
 Exam today appears benign.  We discussed that her symptoms are likely hormonal, especially given recent inconsistent use of OCPs. Given family history will obtain diagnostic mammogram and bilateral breast ultrasounds.  She agrees. Orders placed.

## 2023-08-17 NOTE — Assessment & Plan Note (Signed)
 Exam today seems overall improved compared to her description of the original incident. X-rays of the left foot ordered and pending.

## 2023-08-17 NOTE — Progress Notes (Signed)
 Subjective:    Patient ID: Kimberly Cruz, female    DOB: 1985-12-09, 38 y.o.   MRN: [id number]  HPI  Kimberly Cruz is a very pleasant 38 y.o. 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  1) Breast Pain: Her pain is located to the right breast at the right lateral breast and right middle breast. Her pain began about 3 weeks ago, worse over the last week. She describes her pain as a dull ache, constant.   Her pain subsided 24 hours ago. She has a family history of breast cancer in maternal aunt. LMP was 3 weeks ago. She missed several OCPs over the last few months which has caused irregular and painful menstrual cycles. She's been consistent with OCPs for the last 3 weeks. She is managed on Seasonique and typically has a menstrual cycle every 3 months but since missing pills more recently she's had more irregular cycles.   She denies changes in skin texture, breast masses.   2) Foot Pain: Her pain is located to the left 2nd toe with decrease in ROM. At the time she fell down 15 stairs. She initially noticed bruising which has since resolved. She was evaluated by [facility] who took xrays, nothing was fractured. She was placed in a boot and told to return for additional xrays 1 week later.  Attempted to schedule an appointment with [facility] but could not get through.  Today she's feeling much better but has decrease in ROM to left 2nd toe.   Review of Systems  Genitourinary:        Right breast pain  Musculoskeletal:  Positive for arthralgias.  Skin:  Negative for color change and rash.         Past Medical History:  Diagnosis Date   FHx: migraine headaches    Gastritis 03/23/2018   History of stomach ulcers    Hyperlipidemia    Persistent cough for 3 weeks or longer 10/21/2022   Tonsil, abscess     Social History   Socioeconomic History   Marital status: Significant  Other    Spouse name: Not on file   Number of children: Not on file   Years of education: Not on file   Highest education level: 12th grade  Occupational History   Not on file  Tobacco Use   Smoking status: Never   Smokeless tobacco: Never  Substance and Sexual Activity   Alcohol use: Yes   Drug use: No   Sexual activity: Yes    Birth control/protection: Pill  Other Topics Concern   Not on file  Social History Narrative   Single.   No children.   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    Enjoys hiking, camping, travel.   Social Drivers of Corporate investment banker Strain: Low Risk  (08/16/2023)   Overall Financial Resource Strain (CARDIA)    Difficulty of Paying Living Expenses: Not very hard  Food Insecurity: No Food Insecurity (08/16/2023)   Hunger Vital Sign    Worried About Running Out of Food in the Last Year: Never true    Ran Out of Food in the Last Year: Never true  Transportation Needs: No Transportation Needs (08/16/2023)   PRAPARE - Administrator, Civil Service (Medical): No    Lack of Transportation (Non-Medical): No  Physical Activity: Inactive (08/16/2023)   Exercise Vital Sign  Days of Exercise per Week: 0 days    Minutes of Exercise per Session: Not on file  Stress: Stress Concern Present (08/16/2023)   [employer] of Occupational Health - Occupational Stress Questionnaire    Feeling of Stress: To some extent  Social Connections: Socially Isolated (08/16/2023)   Social Connection and Isolation Panel    Frequency of Communication with Friends and Family: Once a week    Frequency of Social Gatherings with Friends and Family: Never    Attends Religious Services: Never    Database administrator or Organizations: No    Attends Engineer, structural: Not on file    Marital Status: Living with partner  Intimate Partner Violence: Not At Risk (12/30/2021)   Humiliation, Afraid, Rape, and Kick questionnaire    Fear of  Current or Ex-Partner: No    Emotionally Abused: No    Physically Abused: No    Sexually Abused: No    Past Surgical History:  Procedure Laterality Date   ANKLE SURGERY  01/17/1998   SEPTOPLASTY  06/18/2014   TONSILLECTOMY N/A 07/17/2015   Procedure: TONSILLECTOMY;  Surgeon: Chinita Hasten, MD;  Location: [facility];  Service: ENT;  Laterality: N/A;    Family History  Problem Relation Age of Onset   Diverticulitis Father    Diabetes Maternal Grandmother    Heart disease Maternal Grandmother    Hypertension Maternal Grandmother    Prostate cancer Maternal Grandfather    Diverticulitis Paternal Grandmother    Breast cancer Maternal Aunt     Allergies  Allergen Reactions   Zithromax [Azithromycin] Swelling and Anaphylaxis    Current Outpatient Medications on File Prior to Visit  Medication Sig Dispense Refill   Levonorgestrel-Ethinyl Estradiol (SEASONIQUE) 0.15-0.03 &0.01 MG tablet Take 1 tablet by mouth daily.     No current facility-administered medications on file prior to visit.    BP 112/64   Pulse 77   Temp (!) 97.2 F (36.2 C) (Temporal)   Ht 5' 2 (1.575 m)   Wt 181 lb (82.1 kg)   LMP 07/24/2023   SpO2 97%   Breastfeeding No   BMI 33.11 kg/m  Objective:   Physical Exam Cardiovascular:     Rate and Rhythm: Normal rate.  Pulmonary:     Effort: Pulmonary effort is normal.  Chest:  Breasts:    Right: No swelling, mass, skin change or tenderness.     Left: No swelling, mass, skin change or tenderness.    Skin:    General: Skin is warm and dry.  Neurological:     Mental Status: She is alert.           Assessment & Plan:  Acute foot pain, left Assessment & Plan: Exam today seems overall improved compared to her description of the original incident. X-rays of the left foot ordered and pending.  Orders: -     DG Foot Complete Left  Breast pain, right Assessment & Plan: Exam today appears benign.  We discussed that her symptoms  are likely hormonal, especially given recent inconsistent use of OCPs. Given family history will obtain diagnostic mammogram and bilateral breast ultrasounds.  She agrees. Orders placed.  Orders: -     MM 3D DIAGNOSTIC MAMMOGRAM BILATERAL BREAST; Future -     US  BREAST COMPLETE UNI LEFT INC AXILLA; Future -     US  BREAST COMPLETE UNI RIGHT INC AXILLA; Future  Family history of breast cancer in female -     MM 3D  DIAGNOSTIC MAMMOGRAM BILATERAL BREAST; Future -     US  BREAST COMPLETE UNI LEFT INC AXILLA; Future -     US  BREAST COMPLETE UNI RIGHT INC AXILLA; Future        Aliena Ghrist K Deedee Lybarger, NP

## 2023-08-22 ENCOUNTER — Ambulatory Visit
Admission: RE | Admit: 2023-08-22 | Discharge: 2023-08-22 | Disposition: A | Discharge status: Home / Self Care | Source: Ambulatory Visit | Attending: Primary Care | Admitting: Primary Care

## 2023-08-22 DIAGNOSIS — N644 Mastodynia: Secondary | ICD-10-CM

## 2023-08-22 DIAGNOSIS — Z803 Family history of malignant neoplasm of breast: Secondary | ICD-10-CM | POA: Insufficient documentation

## 2023-08-23 ENCOUNTER — Ambulatory Visit: Payer: Self-pay | Admitting: Primary Care

## 2023-09-15 ENCOUNTER — Ambulatory Visit: Payer: Self-pay | Admitting: General Practice

## 2023-09-15 ENCOUNTER — Encounter: Payer: Self-pay | Admitting: General Practice

## 2023-09-15 ENCOUNTER — Ambulatory Visit: Admitting: General Practice

## 2023-09-15 VITALS — BP 132/84 | HR 81 | Temp 98.6°F | Ht 62.0 in | Wt 179.0 lb

## 2023-09-15 DIAGNOSIS — M79602 Pain in left arm: Secondary | ICD-10-CM | POA: Diagnosis not present

## 2023-09-15 MED ORDER — PREDNISONE 10 MG (21) PO TBPK
ORAL_TABLET | ORAL | 0 refills | Status: DC
Start: 2023-09-15 — End: 2023-09-27

## 2023-09-15 MED ORDER — MELOXICAM 7.5 MG PO TABS: 7.5000 mg | ORAL_TABLET | Freq: Every day | ORAL | 0 refills | Status: DC

## 2023-09-15 NOTE — Progress Notes (Signed)
 Established Patient Office Visit  Subjective   Patient ID: Kimberly Cruz, female    DOB: [birth year]  Age: 38 y.o. MRN: [id number]  Chief Complaint  Patient presents with   Arm Pain    Pinching like sensation. Sunday night she noticed pain in her hand like carpel tunnel like. Then that night is moved up her arm and was very painful. She took some of her husbands meloxicam  and that did help some but keeps coming back. Patient thinks maybe its the way she is sleeping and causing nerve damage or something.     Arm Pain  Pertinent negatives include no chest pain.     Discussed the use of AI scribe software for clinical note transcription with the patient, who gave verbal consent to proceed.  History of Present Illness Kimberly Cruz is a 38 year old female, patient of Comer Gaskins, NP, with a past medical history of pulmonary nodule, thyroid  nodule presents today for an acute visit to discuss with left arm pain.  She experiences a pinching and aching sensation in her left arm, which began on Sunday. The pain is persistent and worsens at night, particularly when she is settling into bed. She reports that she sleeps on her left side with her arm under her pillow.  The pain initially started in her left hand and wrist, where she has a history of carpal tunnel syndrome. It radiates from her wrist up her inner lower arm to her upper arm but does not reach her shoulder. No chest pain, and the pain does not affect her ability to hold objects or perform tasks like opening jars. The pain is described as dull and does not worsen with touch. No numbness, tingling, or swelling in the affected areas. She denies any history of trauma or injury to the arm.  She has been using meloxicam , which was prescribed to her fianc, and found it helps her sleep better. She takes it once a day in the morning around 6:00 AM, but notices discomfort returning by 5:30 AM. She has also tried using a brace, but it caused more  discomfort and did not provide relief, so she discontinued its use.   Patient Active Problem List   Diagnosis Date Noted   Pulmonary nodule 10/21/2022   Elevated LFTs 03/24/2021   Multiple thyroid  nodules 12/01/2017   Cervical high risk HPV (human papillomavirus) test positive 02/03/2017   Preventative health care 06/28/2016   Past Medical History:  Diagnosis Date   FHx: migraine headaches    Gastritis 03/23/2018   History of stomach ulcers    Hyperlipidemia    Persistent cough for 3 weeks or longer 10/21/2022   Tonsil, abscess    Past Surgical History:  Procedure Laterality Date   ANKLE SURGERY  01/17/1998   SEPTOPLASTY  06/18/2014   TONSILLECTOMY N/A 07/17/2015   Procedure: TONSILLECTOMY;  Surgeon: Chinita Hasten, MD;  Location: [facility];  Service: ENT;  Laterality: N/A;   Allergies  Allergen Reactions   Zithromax [Azithromycin] Swelling and Anaphylaxis         08/17/2023    8:05 AM 11/28/2022    1:16 PM 03/24/2021    8:31 AM  Depression screen PHQ 2/9  Decreased Interest 0 1 0  Down, Depressed, Hopeless 0 1 0  PHQ - 2 Score 0 2 0  Altered sleeping  1 0  Tired, decreased energy  2 0  Change in appetite  1 0  Feeling bad or failure about yourself  0 0  Trouble concentrating  2 0  Moving slowly or fidgety/restless  0 0  Suicidal thoughts  0 0  PHQ-9 Score  8 0  Difficult doing work/chores  Somewhat difficult Not difficult at all       11/28/2022    1:16 PM  GAD 7 : Generalized Anxiety Score  Nervous, Anxious, on Edge 2  Control/stop worrying 2  Worry too much - different things 1  Trouble relaxing 1  Restless 0  Easily annoyed or irritable 2  Afraid - awful might happen 1  Total GAD 7 Score 9  Anxiety Difficulty Somewhat difficult      Review of Systems  Constitutional:  Negative for chills and fever.  Respiratory:  Negative for shortness of breath.   Cardiovascular:  Negative for chest pain.  Gastrointestinal:  Negative for abdominal  pain, constipation, diarrhea, heartburn, nausea and vomiting.  Genitourinary:  Negative for dysuria, frequency and urgency.  Neurological:  Negative for dizziness and headaches.  Endo/Heme/Allergies:  Negative for polydipsia.  Psychiatric/Behavioral:  Negative for depression and suicidal ideas. The patient is not nervous/anxious.       Objective:     BP 132/84   Pulse 81   Temp 98.6 F (37 C) (Temporal)   Ht 5' 2 (1.575 m)   Wt 179 lb (81.2 kg)   LMP 07/24/2023 (Exact Date)   SpO2 98%   BMI 32.74 kg/m  BP Readings from Last 3 Encounters:  09/15/23 132/84  08/17/23 112/64  11/28/22 138/88   Wt Readings from Last 3 Encounters:  09/15/23 179 lb (81.2 kg)  08/17/23 181 lb (82.1 kg)  11/28/22 174 lb (78.9 kg)      Physical Exam Vitals and nursing note reviewed.  Constitutional:      Appearance: Normal appearance.  Cardiovascular:     Rate and Rhythm: Normal rate and regular rhythm.     Pulses: Normal pulses.     Heart sounds: Normal heart sounds.  Pulmonary:     Effort: Pulmonary effort is normal.     Breath sounds: Normal breath sounds.  Musculoskeletal:     Right elbow: Normal.     Left elbow: Normal.     Right forearm: Normal. No tenderness or bony tenderness.     Left forearm: Normal. No tenderness or bony tenderness.     Right wrist: Normal.     Left wrist: Normal.     Right hand: Normal.     Left hand: Normal.  Neurological:     Mental Status: She is alert and oriented to person, place, and time.  Psychiatric:        Mood and Affect: Mood normal.        Behavior: Behavior normal.        Thought Content: Thought content normal.        Judgment: Judgment normal.      No results found for any visits on 09/15/23.     The ASCVD Risk score (Arnett DK, et al., 2019) failed to calculate for the following reasons:   The 2019 ASCVD risk score is only valid for ages 60 to 54    Assessment & Plan:  Left arm pain -     EKG 12-Lead -     predniSONE ;  Please follow instructions on the package.  Dispense: 1 each; Refill: 0 -     Meloxicam ; Take 1 tablet (7.5 mg total) by mouth daily.  Dispense: 30 tablet; Refill: 0    Assessment and  Plan Assessment & Plan Left arm pain Aching left arm pain at night, radiating from wrist to upper arm, likely due to inflammation or repetitive strain. EKG normal, ruling out cardiac causes. Meloxicam  provided relief, suggesting inflammation. - EKG tracing is personally reviewed.  EKG notes NSR.  No acute changes. Similar to previous EKG in 2024. - Start prednisone  taper pack. - Start meloxicam  7.5 mg daily as needed. Patient educated to only take prescription prescribed to her. - Advise ice for inflammation as tolerated. - Consider x-ray or orthopedic referral if no improvement.   Return if symptoms worsen or fail to improve.    Carrol Aurora, NP

## 2023-09-15 NOTE — Patient Instructions (Signed)
 Start prednisone  pack. Please follow instructions on the package.   Start Meloxicam  7.5 mg once daily as needed for pain.   Please follow up if symptoms worsen or do not improve.   It was a pleasure meeting you!

## 2023-09-21 ENCOUNTER — Telehealth: Payer: Self-pay

## 2023-09-21 NOTE — Telephone Encounter (Signed)
 Spoke with patient she states her arm pain is not any better after medication prescribed during visit with [facility]. Patient requests to schedule with Dr. Watt. Scheduled patient with Dr. Watt on 9/10.

## 2023-09-21 NOTE — Telephone Encounter (Signed)
 Copied from CRM [id number]. Topic: General - Other >> Sep 21, 2023  8:42 AM Kimberly Cruz wrote: Reason for CRM: Patient needs to speak with someone about scheduling a Ortho visit

## 2023-09-25 ENCOUNTER — Ambulatory Visit: Payer: Self-pay

## 2023-09-25 ENCOUNTER — Telehealth: Payer: Self-pay

## 2023-09-25 NOTE — Telephone Encounter (Signed)
 FYI Only or Action Required?: FYI only for provider.  Patient was last seen in primary care on 09/15/2023 by Vincente Shivers, NP.  Called Nurse Triage reporting Advice Only.   Triage Disposition: Information or Advice Only Call  Patient/caregiver understands and will follow disposition?: Yes    Copied from CRM #[id number]. Topic: Clinical - Red Word Triage >> Sep 25, 2023 10:22 AM [place] J wrote: [facility] that prompted transfer to Nurse Triage: Patient is having shoulder and arm pain.    Pt sent to NT in error: pt only wanted to reschedule ortho appt if there was something available prior to the appt the patient already has. Pt stated she will just keep the appt she  has.    Reason for Disposition  Health information question, no triage required and triager able to answer question  Answer Assessment - Initial Assessment Questions 1. REASON FOR CALL: What is the main reason for your call? or How can I best help you? Pt sent to NT in error: pt only wanted to reschedule ortho appt if there was something available prior to the appt the patient already has. Pt stated she will just keep the appt she  has.  Protocols used: Information Only Call - No Triage-A-AH

## 2023-09-25 NOTE — Telephone Encounter (Signed)
 Request sent to [facility] for xrays and progress notes. Called and advised patient of this.

## 2023-09-25 NOTE — Telephone Encounter (Signed)
Noted and appreciate Dr. Copland's evaluation 

## 2023-09-25 NOTE — Telephone Encounter (Signed)
 Copied from CRM [id number]. Topic: General - Other >> Sep 25, 2023  2:47 PM Aisha D wrote: Reason for CRM: Pt stated that she went to [facility] in [place] last week and they had xrays completed. They informed her that the office would need to request her xrays through [employer] in order for her to receive them or she would have to drive an hour away to get them. Pt would like to confirm if the office would be able to request the xrays and would like a callback with an update.

## 2023-09-26 NOTE — Progress Notes (Signed)
 "    Kimberly Cruz T. Kimberly Romaniello, MD, CAQ Sports Medicine [facility] [address] [place], [address]  Phone: [phone]  FAX: [phone]  Kimberly Cruz - 38 y.o. female  MRN [id number]  Date of Birth: [birth year]  Date: 09/27/2023  PCP: Kimberly Comer POUR, NP  Referral: Kimberly Comer POUR, NP  Chief Complaint  Patient presents with   Arm Pain    C/o ongoing L arm pain.    Subjective:   Kimberly Cruz is a 38 y.o. very pleasant female patient with Body mass index is 33.2 kg/m. who presents with the following:  Discussed the use of AI scribe software for clinical note transcription with the patient, who gave verbal consent to proceed.  Kimberly Cruz presents with left-sided arm pain.  It actually looks like she saw Dr. Francisco the hand surgeon only a few days ago.  She has tried meloxicam  and prednisone . History of Present Illness She has been experiencing pain for over two weeks, initially localized to her hand and progressively radiating up her arm to her shoulder. The pain started in her hand, moved to her elbow, and then to her shoulder over the course of two weeks. It is described as a constant ache, particularly noticeable at night, and exacerbated by certain positions during sleep, such as having her arm under her pillow.  No numbness or tingling in her fingers, but she reports sharp pains in her hand. The pain worsens with certain activities, such as when her fianc gives her a side hug, causing discomfort. She has tried various treatments including meloxicam , prednisone , heat, and ice, but none have provided significant relief. Heat seemed to worsen the pain.  Her past medical history includes a fall down the stairs a couple of months ago, during which she grabbed the railing to catch herself, but she did not experience any immediate shoulder pain following the incident. She frequently carries her two-year-old son on her left side, which she suspects  may contribute to her symptoms.  She notes that her shoulder feels tight and that she experiences a crunching sensation when lifting her arm. She reports frequently carrying her two-year-old son on her left side and a prior fall down the stairs, both of which she suspects may contribute to her symptoms. No neck pain, although she feels tightness in the neck area.  Her current medications include meloxicam  and a five-day course of prednisone , neither of which have alleviated her symptoms. She has also tried over-the-counter medications like Tylenol  and ibuprofen  without significant improvement.    Review of Systems is noted in the HPI, as appropriate  Objective:   BP 128/76   Pulse 79   Temp 97.9 F (36.6 C) (Temporal)   Ht 5' 2 (1.575 m)   Wt 181 lb 8 oz (82.3 kg)   LMP 07/22/2023   SpO2 97%   BMI 33.20 kg/m   GEN: No acute distress; alert,appropriate. PULM: Breathing comfortably in no respiratory distress PSYCH: Normally interactive.    CERVICAL SPINE EXAM Range of motion: Flexion, extension, lateral bending, and rotation: Full Spurling's: Negative Pain with terminal motion: Nontender Spinous Processes: NT SCM: NT Upper paracervical muscles: Nontender Upper traps: NT C5-T1 intact, sensation and motor    Shoulder: Bilateral Inspection: No muscle wasting or winging Ecchymosis/edema: neg  AC joint, scapula, clavicle: NT Abduction: full, 5/5 Flexion: full, 5/5 IR, full, lift-off: 5/5 ER at neutral: full, 5/5 AC crossover and compression: neg Neer: neg Hawkins: neg Drop Test: neg Jobe:  neg Supraspinatus insertion: NT Bicipital groove: NT Speed's: neg Yergason's: neg Sulcus sign: neg Scapular dyskinesis: none  Full range of motion at the hand and wrist without any inducible pain with loading the wrist joint and all provocative maneuvers of the hand, elbow, wrist bilaterally Full movement and entirely neurovascularly intact Physical Exam MUSCULOSKELETAL:  Shoulder normal range of motion and strength. NEUROLOGICAL: Motor strength normal in biceps and shoulders.  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 Left arm pain  M79.602      Assessment & Plan Left brachial plexus nerve irritation Pain in left shoulder and arm due to brachial plexus nerve irritation, likely from repetitive use. No numbness, tingling, or significant joint issues. Improvement noted, no EMG needed unless symptoms persist.  I would give this a few months prior to getting EMG. -Mechanism likely repetitive stretch from holding child on the left side, possible from walking German Shepherd - Avoid heavy loading on left shoulder. - Carry child with right arm. - Okay to consult massage therapy, avoid deep pressure. - Avoid chiropractic manipulation. - Re-evaluate if symptoms persist for 2-3 months.  Disposition: Follow-up 6 weeks if not better  [employer] speech-to-text software was used for transcription in this dictation.  Possible transcriptional errors can occur using Animal nutritionist.   Signed,  Kimberly Cruz. Kimberly Hinojosa, MD   Outpatient Encounter Medications as of 09/27/2023  Medication Sig   Levonorgestrel-Ethinyl Estradiol (SEASONIQUE) 0.15-0.03 &0.01 MG tablet Take 1 tablet by mouth daily.   [DISCONTINUED] meloxicam  (MOBIC ) 7.5 MG tablet Take 1 tablet (7.5 mg total) by mouth daily.   [DISCONTINUED] predniSONE  (STERAPRED UNI-PAK 21 TAB) 10 MG (21) TBPK tablet Please follow instructions on the package.   No facility-administered encounter medications on file as of 09/27/2023.   "

## 2023-09-27 ENCOUNTER — Ambulatory Visit (INDEPENDENT_AMBULATORY_CARE_PROVIDER_SITE_OTHER): Admitting: Family Medicine

## 2023-09-27 ENCOUNTER — Encounter: Payer: Self-pay | Admitting: Family Medicine

## 2023-09-27 VITALS — BP 128/76 | HR 79 | Temp 97.9°F | Ht 62.0 in | Wt 181.5 lb

## 2023-09-27 DIAGNOSIS — M79602 Pain in left arm: Secondary | ICD-10-CM | POA: Diagnosis not present

## 2023-09-27 DIAGNOSIS — G540 Brachial plexus disorders: Secondary | ICD-10-CM

## 2023-11-20 ENCOUNTER — Telehealth: Payer: Self-pay | Admitting: Primary Care

## 2023-11-20 NOTE — Telephone Encounter (Deleted)
 Copied from CRM [id number]. Topic: General - Other >> Nov 20, 2023 10:57 AM Victoria A wrote: Reason for CRM: [facility] is requesting  Medical Records [phone] Fax Number [phone]- [phone] Case ID [phone]

## 2023-11-20 NOTE — Telephone Encounter (Signed)
 N/a

## 2023-12-01 DIAGNOSIS — R911 Solitary pulmonary nodule: Secondary | ICD-10-CM

## 2023-12-04 ENCOUNTER — Telehealth: Payer: Self-pay | Admitting: Primary Care

## 2023-12-04 NOTE — Telephone Encounter (Unsigned)
 Copied from CRM #[id number]. Topic: General - Other >> Dec 04, 2023 12:48 PM Frederich PARAS wrote: Reason for RMF:[id number] from [facility] called to check status of pre shara pt has an apt on thursday, status said pending. Reached out to cal they adv to send a crm.  Callback # [phone] They need the pre auth by wed at 10:30am

## 2023-12-04 NOTE — Telephone Encounter (Signed)
Can someone help with this? 

## 2023-12-05 NOTE — Telephone Encounter (Signed)
 Per chart review Auth was entered today I have called and left message at number provided to let them know.

## 2023-12-06 NOTE — Telephone Encounter (Signed)
 Noted

## 2023-12-07 ENCOUNTER — Inpatient Hospital Stay: Admission: RE | Admit: 2023-12-07 | Payer: Self-pay | Source: Ambulatory Visit

## 2024-02-15 ENCOUNTER — Encounter: Payer: Self-pay | Admitting: Primary Care

## 2024-02-23 ENCOUNTER — Other Ambulatory Visit: Payer: Self-pay
